# Patient Record
Sex: Female | Born: 1970 | Race: White | Hispanic: No
Health system: Southern US, Community
[De-identification: ages and names within clinical notes are randomized; demographics above are authoritative.]

---

## 1998-01-22 ENCOUNTER — Other Ambulatory Visit: Admission: RE | Admit: 1998-01-22 | Discharge: 1998-01-22 | Payer: Self-pay | Admitting: Gynecology

## 1999-01-28 ENCOUNTER — Other Ambulatory Visit: Admission: RE | Admit: 1999-01-28 | Discharge: 1999-01-28 | Payer: Self-pay | Admitting: Gynecology

## 2000-01-31 ENCOUNTER — Other Ambulatory Visit: Admission: RE | Admit: 2000-01-31 | Discharge: 2000-01-31 | Payer: Self-pay | Admitting: Gynecology

## 2001-02-06 ENCOUNTER — Other Ambulatory Visit: Admission: RE | Admit: 2001-02-06 | Discharge: 2001-02-06 | Payer: Self-pay | Admitting: Gynecology

## 2002-02-12 ENCOUNTER — Other Ambulatory Visit: Admission: RE | Admit: 2002-02-12 | Discharge: 2002-02-12 | Payer: Self-pay | Admitting: Gynecology

## 2003-02-16 ENCOUNTER — Other Ambulatory Visit: Admission: RE | Admit: 2003-02-16 | Discharge: 2003-02-16 | Payer: Self-pay | Admitting: Gynecology

## 2004-02-19 ENCOUNTER — Other Ambulatory Visit: Admission: RE | Admit: 2004-02-19 | Discharge: 2004-02-19 | Payer: Self-pay | Admitting: Gynecology

## 2004-06-03 ENCOUNTER — Other Ambulatory Visit: Admission: RE | Admit: 2004-06-03 | Discharge: 2004-06-03 | Payer: Self-pay | Admitting: Gynecology

## 2005-01-26 ENCOUNTER — Other Ambulatory Visit: Admission: RE | Admit: 2005-01-26 | Discharge: 2005-01-26 | Payer: Self-pay | Admitting: Gynecology

## 2010-05-27 ENCOUNTER — Encounter
Admission: RE | Admit: 2010-05-27 | Discharge: 2010-05-27 | Payer: Self-pay | Source: Home / Self Care | Attending: Internal Medicine | Admitting: Internal Medicine

## 2020-02-12 ENCOUNTER — Ambulatory Visit: Payer: Self-pay

## 2020-03-23 ENCOUNTER — Other Ambulatory Visit: Payer: Self-pay | Admitting: Obstetrics and Gynecology

## 2020-03-23 DIAGNOSIS — Z1231 Encounter for screening mammogram for malignant neoplasm of breast: Secondary | ICD-10-CM

## 2020-04-01 ENCOUNTER — Other Ambulatory Visit: Payer: Self-pay

## 2020-04-01 ENCOUNTER — Ambulatory Visit
Admission: RE | Admit: 2020-04-01 | Discharge: 2020-04-01 | Disposition: A | Discharge status: Home / Self Care | Payer: BC Managed Care – PPO | Source: Ambulatory Visit | Attending: Obstetrics and Gynecology | Admitting: Obstetrics and Gynecology

## 2020-04-01 DIAGNOSIS — Z1231 Encounter for screening mammogram for malignant neoplasm of breast: Secondary | ICD-10-CM | POA: Insufficient documentation

## 2020-04-08 ENCOUNTER — Other Ambulatory Visit: Payer: Self-pay | Admitting: Obstetrics and Gynecology

## 2020-04-08 DIAGNOSIS — R921 Mammographic calcification found on diagnostic imaging of breast: Secondary | ICD-10-CM

## 2020-04-08 DIAGNOSIS — R928 Other abnormal and inconclusive findings on diagnostic imaging of breast: Secondary | ICD-10-CM

## 2020-04-19 ENCOUNTER — Ambulatory Visit
Admission: RE | Admit: 2020-04-19 | Discharge: 2020-04-19 | Disposition: A | Discharge status: Home / Self Care | Payer: BC Managed Care – PPO | Source: Ambulatory Visit | Attending: Obstetrics and Gynecology | Admitting: Obstetrics and Gynecology

## 2020-04-19 ENCOUNTER — Other Ambulatory Visit: Payer: Self-pay

## 2020-04-19 DIAGNOSIS — R921 Mammographic calcification found on diagnostic imaging of breast: Secondary | ICD-10-CM | POA: Insufficient documentation

## 2020-04-19 DIAGNOSIS — R928 Other abnormal and inconclusive findings on diagnostic imaging of breast: Secondary | ICD-10-CM | POA: Diagnosis present

## 2020-04-21 ENCOUNTER — Other Ambulatory Visit: Payer: Self-pay | Admitting: Obstetrics and Gynecology

## 2020-04-21 DIAGNOSIS — Z1231 Encounter for screening mammogram for malignant neoplasm of breast: Secondary | ICD-10-CM

## 2020-06-29 ENCOUNTER — Ambulatory Visit: Payer: BC Managed Care – PPO | Attending: Obstetrics and Gynecology | Admitting: Physical Therapy

## 2020-06-29 ENCOUNTER — Encounter: Payer: Self-pay | Admitting: Physical Therapy

## 2020-06-29 DIAGNOSIS — G8929 Other chronic pain: Secondary | ICD-10-CM

## 2020-06-29 DIAGNOSIS — M545 Low back pain, unspecified: Secondary | ICD-10-CM | POA: Diagnosis present

## 2020-06-29 DIAGNOSIS — R278 Other lack of coordination: Secondary | ICD-10-CM | POA: Insufficient documentation

## 2020-06-29 DIAGNOSIS — M533 Sacrococcygeal disorders, not elsewhere classified: Secondary | ICD-10-CM | POA: Diagnosis not present

## 2020-06-29 DIAGNOSIS — M62838 Other muscle spasm: Secondary | ICD-10-CM | POA: Diagnosis present

## 2020-06-29 DIAGNOSIS — M6208 Separation of muscle (nontraumatic), other site: Secondary | ICD-10-CM

## 2020-06-29 DIAGNOSIS — M26609 Unspecified temporomandibular joint disorder, unspecified side: Secondary | ICD-10-CM | POA: Diagnosis present

## 2020-06-29 NOTE — Patient Instructions (Signed)
Set up work desk with a foot stool for ankles and feet to be at 90deg   __   Avoid straining pelvic floor, abdominal muscles , spine  Use log rolling technique instead of getting out of bed with your neck or the sit-up     Log rolling into and out of bed   Log rolling into and out of bed If getting out of bed on R side, Bent knees, scoot hips/ shoulder to L  Raise R arm completely overhead, rolling onto armpit  Then lower bent knees to bed to get into complete side lying position  Then drop legs off bed, and push up onto R elbow/forearm, and use L hand to push onto the bed
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
## 2020-07-05 ENCOUNTER — Other Ambulatory Visit: Payer: Self-pay

## 2020-07-05 ENCOUNTER — Ambulatory Visit: Payer: BC Managed Care – PPO | Admitting: Physical Therapy

## 2020-07-05 DIAGNOSIS — R278 Other lack of coordination: Secondary | ICD-10-CM

## 2020-07-05 DIAGNOSIS — G8929 Other chronic pain: Secondary | ICD-10-CM

## 2020-07-05 DIAGNOSIS — M6208 Separation of muscle (nontraumatic), other site: Secondary | ICD-10-CM

## 2020-07-05 DIAGNOSIS — M26609 Unspecified temporomandibular joint disorder, unspecified side: Secondary | ICD-10-CM

## 2020-07-05 DIAGNOSIS — M545 Low back pain, unspecified: Secondary | ICD-10-CM

## 2020-07-05 DIAGNOSIS — M62838 Other muscle spasm: Secondary | ICD-10-CM

## 2020-07-05 NOTE — Therapy (Signed)
[facility] MAIN [facility] SERVICES [address] [place], [place], [address] Phone: [phone]   Fax:  [phone]  Physical Therapy Treatment  Patient Details  Name: Dorothy Owens MRN: [id number] Date of Birth: 1970/05/14 Referring Provider (PT): Christeen Douglas   Encounter Date: 07/05/2020   PT End of Session - 07/05/20 1456    Visit Number 2    Number of Visits 10    Date for PT Re-Evaluation 09/08/20    PT Start Time 1307    PT Stop Time 1400    PT Time Calculation (min) 53 min           No past medical history on file.  No past surgical history on file.  There were no vitals filed for this visit.   Subjective Assessment - 07/05/20 1309    Subjective L side of her her jaw pops any time she want it to. The first time she noticed it was when she got her braces taken off at the age of 89. It feels good to pop it. Pt wore braces again in her 30s. The popping returns despite different treatments. The disk is out of place.  Pt has had a injury when falling on the L side of her face over 30 years ago. Pt sleeps on her R side. Her DC has told her that she has a flat curve in her neck. Her neck is always stiff and the area beteen shoulder blades always feels stiff .  Weight training has helped her shoulder blade area. Pt rarely gets HA.    Pertinent History Hx of twisted ankles R and L,  fall on ice skating in 2013 on R buttock, TMJ, not perinmenopausal yet but periods are shorter    Limitations Lifting    Patient Stated Goals improving skeletal health, bone health and balance              [facility] PT Assessment - 07/05/20 1318      Observation/Other Assessments   Observations L shoulder slightly lowered, R ASIS, patella lowered, more L posterior rotation at T/L junction      AROM   Overall AROM Comments cervical sideflexion 55 deg B, flexion, extension 60 deg, R rotation 65 deg, L 70 deg      Palpation   Spinal mobility hypomobile  thoracic segments    SI assessment  tightness at coccgyeus B, hypomobile SIJ on R lacking nutation, hypomobile L lacking ilia ER    Palpation comment tightness R iliocostalis, medial scapula, L medial scapula , hypmobile T/L junction                         [facility] Adult PT Treatment/Exercise - 07/05/20 1456      Neuro Re-ed    Neuro Re-ed Details  cued for HEP customized for asymmetries in thorax , T/L junction      Manual Therapy   Manual therapy comments STM/MWM to address problem areas noted inassessment                       PT Long Term Goals - 06/30/20 1047      PT LONG TERM GOAL #1   Title Pt will demo proper body mechanics to minimize straining of abdomen and pelvic floor with fitness routine (modifications to sit-up/ crunches) ( weight lifting)    Time 4    Period Weeks    Status New  Target Date 07/28/20      PT LONG TERM GOAL #2   Title Pt will report no LBP occuring across across one month to continue with fitness and ADLs activities    Time 8    Period Weeks    Status New    Target Date 08/25/20      PT LONG TERM GOAL #3   Title Pt will make modifcations to her sitting posture/ work station at work in order to minimize overactivity of pelvic floor    Time 8    Period Weeks    Status New    Target Date 08/25/20      PT LONG TERM GOAL #4   Title Pt will demo proper deep core coordination with proper diaphragmatic excursion in order to optimize postural stability and pelvic floor function    Time 10    Period Weeks    Status New    Target Date 09/08/20      PT LONG TERM GOAL #5   Title Pt will report no straining with bowel movements 100% of the time and improved boewl frequency from 3-4x/week to daily or every other day across 2 weeks in order to restore pelvic floor function    Time 6    Period Weeks    Status New    Target Date 08/11/20      Additional Long Term Goals   Additional Long Term Goals Yes      PT LONG TERM  GOAL #6   Title Pt will demo decreased abdominal separation from 3 fingers width above umbilicus to < 1 fingers width and increased lateral diaphragmatic excursion in order to progress to proper pelvic floor lengthening to minimize pelvic pain and restore bowel movements and sexual function without difficulty    Time 10    Period Weeks    Status New      PT LONG TERM GOAL #7   Title Pt will report decreased TMJ pain from 4/10 to < 1/10 and be IND with relaxation practices in order to increase QOL    Time 8    Period Weeks    Status New    Target Date 08/25/20                 Plan - 07/05/20 1457    Clinical Impression Statement Pt showed asymmetries , hypomobility at thoracic junction and pelvic obliquities. Post manual Tx, pt demo'd more levelled shoulder and less posteriorlry rotated L thorax, and more mobility at SIJ and thoracic supine. Continue to apply regional interdependent approach. One-sided HEP was customized for her different body parts' deficits.  Pt continues to benefit from skilled PT      Personal Factors and Comorbidities Fitness;Other    Stability/Clinical Decision Making Evolving/Moderate complexity    Rehab Potential Good    PT Frequency 1x / week    PT Duration Other (comment)   10   PT Treatment/Interventions Balance training;Neuromuscular re-education;Gait training;Moist Heat;Functional mobility training;Therapeutic activities;Patient/family education;Manual techniques;Therapeutic exercise;Taping;Spinal Manipulations;Joint Manipulations;Scar mobilization;Stair training;Traction;Energy conservation    Consulted and Agree with Plan of Care Patient           Patient will benefit from skilled therapeutic intervention in order to improve the following deficits and impairments:  Increased muscle spasms,Decreased mobility,Decreased coordination,Decreased endurance,Decreased activity tolerance,Decreased range of motion,Decreased strength,Improper body  mechanics,Pain,Postural dysfunction  Visit Diagnosis: Diastasis recti  Other lack of coordination  Other muscle spasm  TMJ dysfunction  Chronic bilateral low back pain without sciatica  Problem List There are no problems to display for this patient.   Mariane Masters ,PT, DPT, E-RYT  07/05/2020, 2:58 PM  [facility] MAIN [facility] SERVICES [address] [place], [place], [address] Phone: [phone]   Fax:  [phone]  Name: Sunday Klos MRN: [id number] Date of Birth: 20-Feb-1971

## 2020-07-05 NOTE — Patient Instructions (Addendum)
   Lengthen Back rib by L  shoulder    Lie on R  side , pillow between knees and under head  Pull  arm overhead over mattress, grab the edge of mattress,pull it upward, drawing elbow away from ears  Breathing 10 reps  Open book (handout)  Lying on  R_ side , rotating  L  only this week  Rotating onto pillow /yoga block  Pillow/ Block between knees  10 reps    Side of hip stretch:  Reclined twist for hips and side of the hips/ legs  Lay on your back, knees bend Scoot hips to the R , leave shoulders in place Wobble knees side to side    __  kitchen counter stretches  Hands on kitchen counter,   Palms shoulder width apart  Minisquat postion Trunk is parallel to floor  A) Pull buttocks back to lengthen spine, knees bent  3 breaths   B) Bring R hand to the L, and stretch the R side trunk  3 breaths   Brings hands to center again Do the same to the L side stretch by placing L hand on top of R   D) Modified thread the needle R hand on L thigh, L  thigh pushing out slightly as the R hands pull in,  elbow bent and pulls to theR,  Look under L armpit   Do the same to other side  ____

## 2020-07-13 ENCOUNTER — Ambulatory Visit: Payer: BC Managed Care – PPO | Admitting: Physical Therapy

## 2020-07-13 ENCOUNTER — Other Ambulatory Visit: Payer: Self-pay

## 2020-07-13 DIAGNOSIS — M26609 Unspecified temporomandibular joint disorder, unspecified side: Secondary | ICD-10-CM

## 2020-07-13 DIAGNOSIS — R278 Other lack of coordination: Secondary | ICD-10-CM

## 2020-07-13 DIAGNOSIS — M6208 Separation of muscle (nontraumatic), other site: Secondary | ICD-10-CM | POA: Diagnosis not present

## 2020-07-13 DIAGNOSIS — M62838 Other muscle spasm: Secondary | ICD-10-CM

## 2020-07-13 DIAGNOSIS — G8929 Other chronic pain: Secondary | ICD-10-CM

## 2020-07-13 DIAGNOSIS — M545 Low back pain, unspecified: Secondary | ICD-10-CM

## 2020-07-13 NOTE — Therapy (Addendum)
[facility] MAIN [facility] SERVICES [address] [place], [place], [address] Phone: [phone]   Fax:  [phone]  Physical Therapy Treatment  Patient Details  Name: Dorothy Owens MRN: [id number] Date of Birth: 03/06/1971 Referring Provider (PT): Christeen Douglas   Encounter Date: 07/13/2020   PT End of Session - 07/13/20 1305    Visit Number 3    Number of Visits 10    Date for PT Re-Evaluation 09/08/20    PT Start Time 1300    PT Stop Time 1400    PT Time Calculation (min) 60 min           No past medical history on file.  No past surgical history on file.  There were no vitals filed for this visit.   Subjective Assessment - 07/13/20 1306    Subjective Pt reeported her R shoulder felt sore after last session. Pt had acupuncture to your tailbone area and it helped to reduce the vaginal  itchiness    Pertinent History Hx of twisted ankles R and L,  fall on ice skating in 2013 on R buttock, TMJ, not perinmenopausal yet but periods are shorter    Limitations Lifting    Patient Stated Goals improving skeletal health, bone health and balance              [facility] PT Assessment - 07/13/20 1356      Palpation   Palpation comment tightness at intercostal L osterior T10-12, hypomobile T12-L1, tightenss along spinalis, longissimus, iliocostalis L cervical to T/L junction         levelled iliac crest                [facility] Adult PT Treatment/Exercise - 07/13/20 1353      Neuro Re-ed    Neuro Re-ed Details  cued for open book      Manual Therapy   Manual therapy comments Distraction/ STM/MWM at problem areea noted in assessment                       PT Long Term Goals - 06/30/20 1047      PT LONG TERM GOAL #1   Title Pt will demo proper body mechanics to minimize straining of abdomen and pelvic floor with fitness routine (modifications to sit-up/ crunches) ( weight lifting)    Time 4    Period Weeks     Status New    Target Date 07/28/20      PT LONG TERM GOAL #2   Title Pt will report no LBP occuring across across one month to continue with fitness and ADLs activities    Time 8    Period Weeks    Status New    Target Date 08/25/20      PT LONG TERM GOAL #3   Title Pt will make modifcations to her sitting posture/ work station at work in order to minimize overactivity of pelvic floor    Time 8    Period Weeks    Status New    Target Date 08/25/20      PT LONG TERM GOAL #4   Title Pt will demo proper deep core coordination with proper diaphragmatic excursion in order to optimize postural stability and pelvic floor function    Time 10    Period Weeks    Status New    Target Date 09/08/20      PT LONG TERM GOAL #5   Title Pt  will report no straining with bowel movements 100% of the time and improved boewl frequency from 3-4x/week to daily or every other day across 2 weeks in order to restore pelvic floor function    Time 6    Period Weeks    Status New    Target Date 08/11/20      Additional Long Term Goals   Additional Long Term Goals Yes      PT LONG TERM GOAL #6   Title Pt will demo decreased abdominal separation from 3 fingers width above umbilicus to < 1 fingers width and increased lateral diaphragmatic excursion in order to progress to proper pelvic floor lengthening to minimize pelvic pain and restore bowel movements and sexual function without difficulty    Time 10    Period Weeks    Status New      PT LONG TERM GOAL #7   Title Pt will report decreased TMJ pain from 4/10 to < 1/10 and be IND with relaxation practices in order to increase QOL    Time 8    Period Weeks    Status New    Target Date 08/25/20                 Plan - 07/13/20 1401    Clinical Impression Statement Pt required further manual Tx to optimize lateral. posterior excursion of diaphragm and lengthen tightness at intercostals, paraspinals on L upper quadrant. Added L posterior rotation  into HEP. Plan to address pelvic floor at upcoming sessions and assess fitness exercises. Pt continues to benefit from skilled PT.    Personal Factors and Comorbidities Fitness;Other    Stability/Clinical Decision Making Evolving/Moderate complexity    Rehab Potential Good    PT Frequency 1x / week    PT Duration Other (comment)   10   PT Treatment/Interventions Balance training;Neuromuscular re-education;Gait training;Moist Heat;Functional mobility training;Therapeutic activities;Patient/family education;Manual techniques;Therapeutic exercise;Taping;Spinal Manipulations;Joint Manipulations;Scar mobilization;Stair training;Traction;Energy conservation    Consulted and Agree with Plan of Care Patient           Patient will benefit from skilled therapeutic intervention in order to improve the following deficits and impairments:  Increased muscle spasms,Decreased mobility,Decreased coordination,Decreased endurance,Decreased activity tolerance,Decreased range of motion,Decreased strength,Improper body mechanics,Pain,Postural dysfunction  Visit Diagnosis: Diastasis recti  Other muscle spasm  Other lack of coordination  TMJ dysfunction  Chronic bilateral low back pain without sciatica     Problem List There are no problems to display for this patient.   Mariane Masters ,PT, DPT, E-RYT  07/13/2020, 2:02 PM  [facility] MAIN [facility] SERVICES [address] [place], [place], [address] Phone: [phone]   Fax:  [phone]  Name: Dorothy Owens MRN: [id number] Date of Birth: 10/31/1970

## 2020-07-13 NOTE — Patient Instructions (Signed)
Open book on L side only , R sidelying  15 reps

## 2020-07-21 ENCOUNTER — Ambulatory Visit: Payer: BC Managed Care – PPO | Admitting: Physical Therapy

## 2020-07-22 ENCOUNTER — Other Ambulatory Visit: Payer: Self-pay

## 2020-07-22 ENCOUNTER — Ambulatory Visit: Payer: BC Managed Care – PPO | Admitting: Physical Therapy

## 2020-07-22 DIAGNOSIS — M6208 Separation of muscle (nontraumatic), other site: Secondary | ICD-10-CM

## 2020-07-22 DIAGNOSIS — M26609 Unspecified temporomandibular joint disorder, unspecified side: Secondary | ICD-10-CM

## 2020-07-22 DIAGNOSIS — M62838 Other muscle spasm: Secondary | ICD-10-CM

## 2020-07-22 DIAGNOSIS — R278 Other lack of coordination: Secondary | ICD-10-CM

## 2020-07-22 DIAGNOSIS — G8929 Other chronic pain: Secondary | ICD-10-CM

## 2020-07-22 NOTE — Therapy (Signed)
[place] [facility] MAIN [facility] SERVICES [address] [place], [place], [address] Phone: [phone]   Fax:  [phone]  Physical Therapy Treatment  Patient Details  Name: Dorothy Owens MRN: [id number] Date of Birth: 1971/02/28 Referring Provider (PT): Christeen Douglas   Encounter Date: 07/22/2020   PT End of Session - 07/22/20 1358    Visit Number 4    Number of Visits 10    Date for PT Re-Evaluation 09/08/20    PT Start Time 1304    PT Stop Time 1400    PT Time Calculation (min) 56 min    Activity Tolerance Patient tolerated treatment well    Behavior During Therapy [facility] for tasks assessed/performed           No past medical history on file.  No past surgical history on file.  There were no vitals filed for this visit.   Subjective Assessment - 07/22/20 1306    Subjective Pt reported her boyfriend gave her feedback about her posture. Pt strained her intercostals while laughing hard. R shoulder has been bothering her. When she is doing weight training, she feels her L shoulder pop    Pertinent History Hx of twisted ankles R and L,  fall on ice skating in 2013 on R buttock, TMJ, not perinmenopausal yet but periods are shorter    Limitations Lifting    Patient Stated Goals improving skeletal health, bone health and balance              [facility] PT Assessment - 07/22/20 1308      Observation/Other Assessments   Observations R shoulder lowered than L      Other:   Other/ Comments L thumb at T6, R thumb at T8 behind reach  ( post Tx: T7)   reach behind back, digit III at same location      Palpation   Spinal mobility hypomobile at T10    Palpation comment PA mob Grade III at T-8-12, intercostals posterior/ lateral/ anteriorl B , teres minor. subscapularis R                         [facility] Adult PT Treatment/Exercise - 07/22/20 1355      Neuro Re-ed    Neuro Re-ed Details  cued for expanded diaphragm . pelvic floor       Manual Therapy   Manual therapy comments PA mob Grade III at T-8-12, intercostals posterior/ lateral/ anteriorl B , teres minor. subscapularis R ,                       PT Long Term Goals - 06/30/20 1047      PT LONG TERM GOAL #1   Title Pt will demo proper body mechanics to minimize straining of abdomen and pelvic floor with fitness routine (modifications to sit-up/ crunches) ( weight lifting)    Time 4    Period Weeks    Status New    Target Date 07/28/20      PT LONG TERM GOAL #2   Title Pt will report no LBP occuring across across one month to continue with fitness and ADLs activities    Time 8    Period Weeks    Status New    Target Date 08/25/20      PT LONG TERM GOAL #3   Title Pt will make modifcations to her sitting posture/ work station at work in order to minimize  overactivity of pelvic floor    Time 8    Period Weeks    Status New    Target Date 08/25/20      PT LONG TERM GOAL #4   Title Pt will demo proper deep core coordination with proper diaphragmatic excursion in order to optimize postural stability and pelvic floor function    Time 10    Period Weeks    Status New    Target Date 09/08/20      PT LONG TERM GOAL #5   Title Pt will report no straining with bowel movements 100% of the time and improved boewl frequency from 3-4x/week to daily or every other day across 2 weeks in order to restore pelvic floor function    Time 6    Period Weeks    Status New    Target Date 08/11/20      Additional Long Term Goals   Additional Long Term Goals Yes      PT LONG TERM GOAL #6   Title Pt will demo decreased abdominal separation from 3 fingers width above umbilicus to < 1 fingers width and increased lateral diaphragmatic excursion in order to progress to proper pelvic floor lengthening to minimize pelvic pain and restore bowel movements and sexual function without difficulty    Time 10    Period Weeks    Status New      PT LONG TERM GOAL #7   Title  Pt will report decreased TMJ pain from 4/10 to < 1/10 and be IND with relaxation practices in order to increase QOL    Time 8    Period Weeks    Status New    Target Date 08/25/20                 Plan - 07/22/20 1358    Clinical Impression Statement Pt required more manual Tx to minimize tightness at R thoracic tightness. Pt reported it does not feel as tight / constricted at her ribs to breathe. Pt demo'd improved diaphragmatic excursion which will help with pelvic floor lengthening and improved functional reaching with R hand behind back. . Plan to assess pelvic floor at upcoming session and modifying her work out routine. Pt continues to benefit from skilled PT.    Personal Factors and Comorbidities Fitness;Other    Stability/Clinical Decision Making Evolving/Moderate complexity    Rehab Potential Good    PT Frequency 1x / week    PT Duration Other (comment)   10   PT Treatment/Interventions Balance training;Neuromuscular re-education;Gait training;Moist Heat;Functional mobility training;Therapeutic activities;Patient/family education;Manual techniques;Therapeutic exercise;Taping;Spinal Manipulations;Joint Manipulations;Scar mobilization;Stair training;Traction;Energy conservation    Consulted and Agree with Plan of Care Patient           Patient will benefit from skilled therapeutic intervention in order to improve the following deficits and impairments:  Increased muscle spasms,Decreased mobility,Decreased coordination,Decreased endurance,Decreased activity tolerance,Decreased range of motion,Decreased strength,Improper body mechanics,Pain,Postural dysfunction  Visit Diagnosis: Diastasis recti  Other muscle spasm  Other lack of coordination  TMJ dysfunction  Chronic bilateral low back pain without sciatica     Problem List There are no problems to display for this patient.   Mariane Masters ,PT, DPT, E-RYT  07/22/2020, 2:01 PM  [place] [facility] MAIN [facility] SERVICES [address] [place], [place], [address] Phone: [phone]   Fax:  [phone]  Name: Davona Kinoshita MRN: [id number] Date of Birth: [birth year]

## 2020-07-27 ENCOUNTER — Other Ambulatory Visit: Payer: Self-pay

## 2020-07-27 ENCOUNTER — Ambulatory Visit: Payer: BC Managed Care – PPO | Admitting: Physical Therapy

## 2020-07-27 ENCOUNTER — Encounter: Payer: Self-pay | Admitting: Physical Therapy

## 2020-07-27 DIAGNOSIS — M62838 Other muscle spasm: Secondary | ICD-10-CM

## 2020-07-27 DIAGNOSIS — M6208 Separation of muscle (nontraumatic), other site: Secondary | ICD-10-CM | POA: Diagnosis not present

## 2020-07-27 DIAGNOSIS — G8929 Other chronic pain: Secondary | ICD-10-CM

## 2020-07-27 DIAGNOSIS — R278 Other lack of coordination: Secondary | ICD-10-CM

## 2020-07-27 DIAGNOSIS — M26609 Unspecified temporomandibular joint disorder, unspecified side: Secondary | ICD-10-CM

## 2020-07-27 NOTE — Patient Instructions (Signed)
Squeeze shoulders down and back, chin tuck in fitness routine with upper arm exercises  positional techniques for less pelvic pain

## 2020-07-27 NOTE — Therapy (Addendum)
[place] [facility] MAIN [facility] SERVICES [address] [place], [place], [address] Phone: [phone]   Fax:  [phone]  Physical Therapy Treatment  Patient Details  Name: Dorothy Owens MRN: [id number] Date of Birth: 1970/12/27 Referring Provider (PT): Christeen Douglas   Encounter Date: 07/27/2020   PT End of Session - 07/27/20 1358    Visit Number 5    Number of Visits 10    Date for PT Re-Evaluation 09/08/20    PT Start Time 1303    PT Stop Time 1404    PT Time Calculation (min) 61 min    Activity Tolerance Patient tolerated treatment well    Behavior During Therapy [facility] for tasks assessed/performed           History reviewed. No pertinent past medical history.  History reviewed. No pertinent surgical history.  There were no vitals filed for this visit.   Subjective Assessment - 07/27/20 1306    Subjective Pt reported after last session , she was able to breath for the first time without pain in the ribcage. There was a little bit of pain on teh R back rib. Pt did a workout on Saturday and she noticed catching in her clavical and L shoulder, bottom L rib. Last night, she did bench presses and Pavlov presses and dead bug presses.    Pertinent History Hx of twisted ankles R and L,  fall on ice skating in 2013 on R buttock, TMJ, not perinmenopausal yet but periods are shorter    Limitations Lifting    Patient Stated Goals improving skeletal health, bone health and balance              [facility] PT Assessment - 07/27/20 1320      Other:   Other/ Comments quadriped/  pushing will upepr trap overuse      Palpation   SI assessment  levelled pelvic girdle,    Palpation comment increased paraspinal R T/L junction, scalenes B   delayed R scapular downward, dyskinesis                        [facility] Adult PT Treatment/Exercise - 07/27/20 1356      Neuro Re-ed    Neuro Re-ed Details  cued for pelvic floor quick sqeeze, scapular  retraction and depression      Moist Heat Therapy   Number Minutes Moist Heat 5 Minutes    Moist Heat Location --   thoracic     Manual Therapy   Manual therapy comments STM/MWM, PA mob GRade III atT10-12, intercostals, scalenes B                       PT Long Term Goals - 06/30/20 1047      PT LONG TERM GOAL #1   Title Pt will demo proper body mechanics to minimize straining of abdomen and pelvic floor with fitness routine (modifications to sit-up/ crunches) ( weight lifting)    Time 4    Period Weeks    Status New    Target Date 07/28/20      PT LONG TERM GOAL #2   Title Pt will report no LBP occuring across across one month to continue with fitness and ADLs activities    Time 8    Period Weeks    Status New    Target Date 08/25/20      PT LONG TERM GOAL #3   Title Pt  will make modifcations to her sitting posture/ work station at work in order to minimize overactivity of pelvic floor    Time 8    Period Weeks    Status New    Target Date 08/25/20      PT LONG TERM GOAL #4   Title Pt will demo proper deep core coordination with proper diaphragmatic excursion in order to optimize postural stability and pelvic floor function    Time 10    Period Weeks    Status New    Target Date 09/08/20      PT LONG TERM GOAL #5   Title Pt will report no straining with bowel movements 100% of the time and improved boewl frequency from 3-4x/week to daily or every other day across 2 weeks in order to restore pelvic floor function    Time 6    Period Weeks    Status New    Target Date 08/11/20      Additional Long Term Goals   Additional Long Term Goals Yes      PT LONG TERM GOAL #6   Title Pt will demo decreased abdominal separation from 3 fingers width above umbilicus to < 1 fingers width and increased lateral diaphragmatic excursion in order to progress to proper pelvic floor lengthening to minimize pelvic pain and restore bowel movements and sexual function without  difficulty    Time 10    Period Weeks    Status New      PT LONG TERM GOAL #7   Title Pt will report decreased TMJ pain from 4/10 to < 1/10 and be IND with relaxation practices in order to increase QOL    Time 8    Period Weeks    Status New    Target Date 08/25/20                 Plan - 07/27/20 1358    Clinical Impression Statement Pt required further manual Tx to minimize tightness of interspinal/ paraspinal mm at R T/L junction and scalenes. Pt also showed improved scapular retraction/depression after cues. Pt was cued for less upper trap overuse in fitness/ quadriped positions. Pt continues to benefit from skilled PT. Plan to review her fitness routine at next session.        Personal Factors and Comorbidities Fitness;Other    Stability/Clinical Decision Making Evolving/Moderate complexity    Rehab Potential Good    PT Frequency 1x / week    PT Duration Other (comment)   10   PT Treatment/Interventions Balance training;Neuromuscular re-education;Gait training;Moist Heat;Functional mobility training;Therapeutic activities;Patient/family education;Manual techniques;Therapeutic exercise;Taping;Spinal Manipulations;Joint Manipulations;Scar mobilization;Stair training;Traction;Energy conservation    Consulted and Agree with Plan of Care Patient           Patient will benefit from skilled therapeutic intervention in order to improve the following deficits and impairments:  Increased muscle spasms,Decreased mobility,Decreased coordination,Decreased endurance,Decreased activity tolerance,Decreased range of motion,Decreased strength,Improper body mechanics,Pain,Postural dysfunction  Visit Diagnosis: Diastasis recti  Other muscle spasm  Other lack of coordination  TMJ dysfunction  Chronic bilateral low back pain without sciatica     Problem List There are no problems to display for this patient.   Mariane Masters ,PT, DPT, E-RYT  07/27/2020, 1:59 PM  [facility] MAIN [facility] SERVICES [address] [place], [place], [address] Phone: [phone]   Fax:  [phone]  Name: Dorothy Owens MRN: [id number] Date of Birth: October 26, 1970

## 2020-08-03 ENCOUNTER — Ambulatory Visit: Payer: BC Managed Care – PPO | Admitting: Physical Therapy

## 2020-08-04 ENCOUNTER — Ambulatory Visit: Payer: BC Managed Care – PPO | Attending: Obstetrics and Gynecology | Admitting: Physical Therapy

## 2020-08-04 DIAGNOSIS — M545 Low back pain, unspecified: Secondary | ICD-10-CM | POA: Insufficient documentation

## 2020-08-04 DIAGNOSIS — M6208 Separation of muscle (nontraumatic), other site: Secondary | ICD-10-CM | POA: Insufficient documentation

## 2020-08-04 DIAGNOSIS — R278 Other lack of coordination: Secondary | ICD-10-CM | POA: Insufficient documentation

## 2020-08-04 DIAGNOSIS — G8929 Other chronic pain: Secondary | ICD-10-CM | POA: Insufficient documentation

## 2020-08-04 DIAGNOSIS — M26609 Unspecified temporomandibular joint disorder, unspecified side: Secondary | ICD-10-CM | POA: Insufficient documentation

## 2020-08-04 DIAGNOSIS — M62838 Other muscle spasm: Secondary | ICD-10-CM | POA: Insufficient documentation

## 2020-08-05 ENCOUNTER — Other Ambulatory Visit: Payer: Self-pay

## 2020-08-05 ENCOUNTER — Ambulatory Visit: Payer: BC Managed Care – PPO | Admitting: Physical Therapy

## 2020-08-05 DIAGNOSIS — M6208 Separation of muscle (nontraumatic), other site: Secondary | ICD-10-CM | POA: Diagnosis not present

## 2020-08-05 DIAGNOSIS — R278 Other lack of coordination: Secondary | ICD-10-CM

## 2020-08-05 DIAGNOSIS — M62838 Other muscle spasm: Secondary | ICD-10-CM

## 2020-08-05 DIAGNOSIS — M545 Low back pain, unspecified: Secondary | ICD-10-CM | POA: Diagnosis present

## 2020-08-05 DIAGNOSIS — G8929 Other chronic pain: Secondary | ICD-10-CM | POA: Diagnosis present

## 2020-08-05 DIAGNOSIS — M26609 Unspecified temporomandibular joint disorder, unspecified side: Secondary | ICD-10-CM

## 2020-08-05 NOTE — Patient Instructions (Signed)
Locust pose  Pillow under hips if needed for decreased low back pain  Palms face midline by hips  Finger tips shooting straight down  Imagine holding pencil under your armpits Draw shoulders away from ears Inhale Exhale lift chest up slightly without feeling it in your back. The bend happens in the midback  (keep chin tucked)   10 reps  __  Low cobra: modified   On belly, palms under armpits, elbows pointed to ceiling  Inhale: lengthen crown of the head away from shoulders Exhale, feel belly hug in and press palms into the floor, squeezing elbows/shoulder blades towards each other while chest lifts about 5 cm off of the floor. You should feel the hinging movement at mid back and not the low back.   10 reps

## 2020-08-06 NOTE — Therapy (Signed)
[place] [facility] MAIN [facility] SERVICES [address] [place], [place], [address] Phone: [phone]   Fax:  [phone]  Physical Therapy Treatment  Patient Details  Name: Dorothy Owens MRN: [id number] Date of Birth: Sep 18, 1970 Referring Provider (PT): Christeen Douglas   Encounter Date: 08/05/2020   PT End of Session - 08/05/20 1309    Visit Number 6    Number of Visits 10    Date for PT Re-Evaluation 09/08/20    PT Start Time 1300    PT Stop Time 1400    PT Time Calculation (min) 60 min    Activity Tolerance Patient tolerated treatment well    Behavior During Therapy [facility] for tasks assessed/performed           No past medical history on file.  No past surgical history on file.  There were no vitals filed for this visit.   Subjective Assessment - 08/05/20 1310    Subjective Pt reported she feels her shoulder is looser but there is still  catch on the R.    Pertinent History Hx of twisted ankles R and L,  fall on ice skating in 2013 on R buttock, TMJ, not perinmenopausal yet but periods are shorter    Limitations Lifting    Patient Stated Goals improving skeletal health, bone health and balance              [facility] PT Assessment - 08/06/20 0812      Coordination   Coordination and Movement Description minor delayed R scapular downward, dyskinesis      Palpation   Spinal mobility tightness at L T/J junction, R upper scapular    SI assessment  L SIJ hypomoile in FADDIR                         [facility] Adult PT Treatment/Exercise - 08/06/20 0109      Neuro Re-ed    Neuro Re-ed Details  cued for cervicoscapular stabilizing HEP      Moist Heat Therapy   Number Minutes Moist Heat 5 Minutes    Moist Heat Location --   thoracic     Manual Therapy   Manual therapy comments STM/MWM, PA mob Grade III, interspinals L and areas noted in assessment                       PT Long Term Goals - 06/30/20 1047       PT LONG TERM GOAL #1   Title Pt will demo proper body mechanics to minimize straining of abdomen and pelvic floor with fitness routine (modifications to sit-up/ crunches) ( weight lifting)    Time 4    Period Weeks    Status New    Target Date 07/28/20      PT LONG TERM GOAL #2   Title Pt will report no LBP occuring across across one month to continue with fitness and ADLs activities    Time 8    Period Weeks    Status New    Target Date 08/25/20      PT LONG TERM GOAL #3   Title Pt will make modifcations to her sitting posture/ work station at work in order to minimize overactivity of pelvic floor    Time 8    Period Weeks    Status New    Target Date 08/25/20      PT LONG TERM GOAL #4  Title Pt will demo proper deep core coordination with proper diaphragmatic excursion in order to optimize postural stability and pelvic floor function    Time 10    Period Weeks    Status New    Target Date 09/08/20      PT LONG TERM GOAL #5   Title Pt will report no straining with bowel movements 100% of the time and improved boewl frequency from 3-4x/week to daily or every other day across 2 weeks in order to restore pelvic floor function    Time 6    Period Weeks    Status New    Target Date 08/11/20      Additional Long Term Goals   Additional Long Term Goals Yes      PT LONG TERM GOAL #6   Title Pt will demo decreased abdominal separation from 3 fingers width above umbilicus to < 1 fingers width and increased lateral diaphragmatic excursion in order to progress to proper pelvic floor lengthening to minimize pelvic pain and restore bowel movements and sexual function without difficulty    Time 10    Period Weeks    Status New      PT LONG TERM GOAL #7   Title Pt will report decreased TMJ pain from 4/10 to < 1/10 and be IND with relaxation practices in order to increase QOL    Time 8    Period Weeks    Status New    Target Date 08/25/20                 Plan - 08/05/20  1309    Clinical Impression Statement Pt continued to require manual Tx to minimize R upper quadrant tightness and restrictions. Pt continues to report it no longer hurts to breathe and demostrates improved diaphragmatic excursion. Pt required cues for cervical-scapular retraction and stabilizing principles. Pt continues to benefit from skilled PT    Personal Factors and Comorbidities Fitness;Other    Stability/Clinical Decision Making Evolving/Moderate complexity    Rehab Potential Good    PT Frequency 1x / week    PT Duration Other (comment)   10   PT Treatment/Interventions Balance training;Neuromuscular re-education;Gait training;Moist Heat;Functional mobility training;Therapeutic activities;Patient/family education;Manual techniques;Therapeutic exercise;Taping;Spinal Manipulations;Joint Manipulations;Scar mobilization;Stair training;Traction;Energy conservation    Consulted and Agree with Plan of Care Patient           Patient will benefit from skilled therapeutic intervention in order to improve the following deficits and impairments:  Increased muscle spasms,Decreased mobility,Decreased coordination,Decreased endurance,Decreased activity tolerance,Decreased range of motion,Decreased strength,Improper body mechanics,Pain,Postural dysfunction  Visit Diagnosis: Diastasis recti  Other muscle spasm  Other lack of coordination  TMJ dysfunction  Chronic bilateral low back pain without sciatica     Problem List There are no problems to display for this patient.   Mariane Masters ,PT, DPT, E-RYT  08/06/2020, 8:13 AM  [place] [facility] MAIN [facility] SERVICES [address] [place], [place], [address] Phone: [phone]   Fax:  [phone]  Name: Ziona Wickens MRN: [id number] Date of Birth: 1970/10/10

## 2020-08-09 ENCOUNTER — Other Ambulatory Visit: Payer: Self-pay

## 2020-08-09 ENCOUNTER — Ambulatory Visit: Payer: BC Managed Care – PPO | Admitting: Physical Therapy

## 2020-08-09 DIAGNOSIS — R278 Other lack of coordination: Secondary | ICD-10-CM

## 2020-08-09 DIAGNOSIS — M6208 Separation of muscle (nontraumatic), other site: Secondary | ICD-10-CM

## 2020-08-09 DIAGNOSIS — M545 Low back pain, unspecified: Secondary | ICD-10-CM

## 2020-08-09 DIAGNOSIS — M26609 Unspecified temporomandibular joint disorder, unspecified side: Secondary | ICD-10-CM

## 2020-08-09 DIAGNOSIS — G8929 Other chronic pain: Secondary | ICD-10-CM

## 2020-08-09 DIAGNOSIS — M62838 Other muscle spasm: Secondary | ICD-10-CM

## 2020-08-09 NOTE — Therapy (Signed)
[place] [facility] [facility] MAIN [facility] SERVICES [address] [place], [place], [address] Phone: [phone]   Fax:  [phone]  Physical Therapy Treatment  Patient Details  Name: Dorothy Owens MRN: [id number] Date of Birth: 1971-04-19 Referring Provider (PT): Christeen Douglas   Encounter Date: 08/09/2020   PT End of Session - 08/09/20 1356    Visit Number 7    Number of Visits 10    Date for PT Re-Evaluation 09/08/20    PT Start Time 1302    PT Stop Time 1400    PT Time Calculation (min) 58 min    Activity Tolerance Patient tolerated treatment well    Behavior During Therapy [facility] for tasks assessed/performed           No past medical history on file.  No past surgical history on file.  There were no vitals filed for this visit.   Subjective Assessment - 08/09/20 1307    Subjective Pt reported she feels her shoulder is looser but there is still  catch on the R.    Pertinent History Hx of twisted ankles R and L,  fall on ice skating in 2013 on R buttock, TMJ, not perinmenopausal yet but periods are shorter    Limitations Lifting    Patient Stated Goals improving skeletal health, bone health and balance              [facility] PT Assessment - 08/09/20 1358      Observation/Other Assessments   Observations excessive T/J lordosis      Other:   Other/Comments shooulder IR , cleaning with kettlebell swing with catch with shoulder blades      Palpation   Palpation comment tightness at R wrist/ pain with wall push up , overuse of upper trap                         [facility] Adult PT Treatment/Exercise - 08/09/20 1404      Therapeutic Activites    Other Therapeutic Activities modified kettle cleans, push ups      Neuro Re-ed    Neuro Re-ed Details  cued for seated postural control, scapulothoracic stabilization with less lumbar lordosis      Manual Therapy   Manual therapy comments STM/MWM shoulder ER, forearm supination, distraction  at wrists,                       PT Long Term Goals - 06/30/20 1047      PT LONG TERM GOAL #1   Title Pt will demo proper body mechanics to minimize straining of abdomen and pelvic floor with fitness routine (modifications to sit-up/ crunches) ( weight lifting)    Time 4    Period Weeks    Status New    Target Date 07/28/20      PT LONG TERM GOAL #2   Title Pt will report no LBP occuring across across one month to continue with fitness and ADLs activities    Time 8    Period Weeks    Status New    Target Date 08/25/20      PT LONG TERM GOAL #3   Title Pt will make modifcations to her sitting posture/ work station at work in order to minimize overactivity of pelvic floor    Time 8    Period Weeks    Status New    Target Date 08/25/20      PT  LONG TERM GOAL #4   Title Pt will demo proper deep core coordination with proper diaphragmatic excursion in order to optimize postural stability and pelvic floor function    Time 10    Period Weeks    Status New    Target Date 09/08/20      PT LONG TERM GOAL #5   Title Pt will report no straining with bowel movements 100% of the time and improved boewl frequency from 3-4x/week to daily or every other day across 2 weeks in order to restore pelvic floor function    Time 6    Period Weeks    Status New    Target Date 08/11/20      Additional Long Term Goals   Additional Long Term Goals Yes      PT LONG TERM GOAL #6   Title Pt will demo decreased abdominal separation from 3 fingers width above umbilicus to < 1 fingers width and increased lateral diaphragmatic excursion in order to progress to proper pelvic floor lengthening to minimize pelvic pain and restore bowel movements and sexual function without difficulty    Time 10    Period Weeks    Status New      PT LONG TERM GOAL #7   Title Pt will report decreased TMJ pain from 4/10 to < 1/10 and be IND with relaxation practices in order to increase QOL    Time 8     Period Weeks    Status New    Target Date 08/25/20                 Plan - 08/09/20 1357    Clinical Impression Statement Pt demo'd less paraspinal mm tightness and deviations to spine. Pt required cues for postural stability and T/L junction hypermobility with kettle bell clean motion and wall pushups. Pt required manual Tx to promote more wrist extensions, scapular retraction in wall push ups to minimize upper traps. Pt continues to demo improved diaphragmatic excursion but required cues for less lumbar lordosis with inhalation. Plan to add more thoracolumbar strengthening and fitness modifications with stability principles to minimize injuries. Plan to address pelvic floor at upcoming session after pt demonstrates better deep core coordination .   Pt continues to benefit from skilled PT      Personal Factors and Comorbidities Fitness;Other    Stability/Clinical Decision Making Evolving/Moderate complexity    Rehab Potential Good    PT Frequency 1x / week    PT Duration Other (comment)   10   PT Treatment/Interventions Balance training;Neuromuscular re-education;Gait training;Moist Heat;Functional mobility training;Therapeutic activities;Patient/family education;Manual techniques;Therapeutic exercise;Taping;Spinal Manipulations;Joint Manipulations;Scar mobilization;Stair training;Traction;Energy conservation    Consulted and Agree with Plan of Care Patient           Patient will benefit from skilled therapeutic intervention in order to improve the following deficits and impairments:  Increased muscle spasms,Decreased mobility,Decreased coordination,Decreased endurance,Decreased activity tolerance,Decreased range of motion,Decreased strength,Improper body mechanics,Pain,Postural dysfunction  Visit Diagnosis: Diastasis recti  Other muscle spasm  Other lack of coordination  TMJ dysfunction  Chronic bilateral low back pain without sciatica     Problem List There are no  problems to display for this patient.   Mariane Masters ,PT, DPT, E-RYT  08/09/2020, 2:06 PM  [place] [facility] MAIN [facility] SERVICES [address] [place], [place], [address] Phone: [phone]   Fax:  [phone]  Name: Dorothy Owens MRN: [id number] Date of Birth: 1971-04-27

## 2020-08-09 NOTE — Patient Instructions (Addendum)
Puff up at chest and low back Head tall  __  kettle ball hold like suitcase  shoulder blade on spine , shoulder socket  Elbow by ribs   __  heels into floor with wall push , elbow up close to ribs   __  Ballmounds down of palms in push up

## 2020-08-18 ENCOUNTER — Ambulatory Visit: Payer: BC Managed Care – PPO | Admitting: Physical Therapy

## 2020-08-25 ENCOUNTER — Other Ambulatory Visit: Payer: Self-pay

## 2020-08-25 ENCOUNTER — Ambulatory Visit: Payer: BC Managed Care – PPO | Admitting: Physical Therapy

## 2020-08-25 DIAGNOSIS — R278 Other lack of coordination: Secondary | ICD-10-CM

## 2020-08-25 DIAGNOSIS — M62838 Other muscle spasm: Secondary | ICD-10-CM

## 2020-08-25 DIAGNOSIS — M6208 Separation of muscle (nontraumatic), other site: Secondary | ICD-10-CM

## 2020-08-25 DIAGNOSIS — M26609 Unspecified temporomandibular joint disorder, unspecified side: Secondary | ICD-10-CM

## 2020-08-25 DIAGNOSIS — G8929 Other chronic pain: Secondary | ICD-10-CM

## 2020-08-26 NOTE — Patient Instructions (Signed)
Multifidis twist  Band is on doorknob: stand further away from door (facing perpendicular)   Twisting trunk without moving the hips and knees Hold band at the level of ribcage, elbows bent,shoulder blades roll back and down like squeezing a pencil under armpit    Exhale twist,.10-15 deg away from door without moving your hips/ knees. Continue to maintain equal weight through legs. Keep knee unlocked.  20 x 2   __  Band over door Arms in "w" position Minisquat: Scoot buttocks back slight, hinge like you are looking at your reflection on a pond  Knees behind toes,  Inhale to "smell flowers"  Exhale on the rise "like rocket"  Do not lock knees, have more weight across ballmounds of feet, toes relaxed   10 reps x 3 x day   ___  Rotator cuff series: of L arm only  20 reps   Stand perpendicular to door,   1) internal rotation 2) external rotation  3) tricep in lunge ( lean forward, AVOID arching back)    Band in L hand, R hand stabilizing it at hip  1) thumb up, shoulder abduction 30 deg 2) thumb forward like carrying a handle, fingers towards body, shoulder abduction 30 deg

## 2020-08-26 NOTE — Therapy (Signed)
[place] [facility] MAIN [facility] SERVICES [address] [place], [place], [address] Phone: [phone]   Fax:  [phone]  Physical Therapy Treatment  Patient Details  Name: Dorothy Owens MRN: [id number] Date of Birth: 04/28/1971 Referring Provider (PT): Christeen Douglas   Encounter Date: 08/25/2020   PT End of Session - 08/26/20 1036    Visit Number 8    Number of Visits 10    Date for PT Re-Evaluation 09/08/20    PT Start Time 1303    PT Stop Time 1404    PT Time Calculation (min) 61 min    Activity Tolerance Patient tolerated treatment well    Behavior During Therapy [facility] for tasks assessed/performed           No past medical history on file.  No past surgical history on file.  There were no vitals filed for this visit.   Subjective Assessment - 08/25/20 1307    Subjective Pt reported she tried to  picked up the kettle bell in her workout and noticed popping at shoulder blade area like something is rubbing over the rib. Pt noticed it popping when putting away a medicine ball.    Pertinent History Hx of twisted ankles R and L,  fall on ice skating in 2013 on R buttock, TMJ, not perinmenopausal yet but periods are shorter    Limitations Lifting    Patient Stated Goals improving skeletal health, bone health and balance              [facility] PT Assessment - 08/26/20 1140      Strength   Overall Strength Comments LUE 4-/5, R 5/5      Palpation   Spinal mobility hypomobile T7-12 PAVM, L interspinal mm tightness    SI assessment  levelled shoulders and iliac crest,  L posterior rotation less of thorax                         [facility] Adult PT Treatment/Exercise - 08/26/20 1146      Neuro Re-ed    Neuro Re-ed Details  cued for rotator cuff L, B  scapulothroacic and multifidis strengthening      Moist Heat Therapy   Moist Heat Location --   throacic , with new HEP instruction     Manual Therapy   Manual therapy comments PA  mob Grade III T7-10, STM/ interspinals.                       PT Long Term Goals - 06/30/20 1047      PT LONG TERM GOAL #1   Title Pt will demo proper body mechanics to minimize straining of abdomen and pelvic floor with fitness routine (modifications to sit-up/ crunches) ( weight lifting)    Time 4    Period Weeks    Status New    Target Date 07/28/20      PT LONG TERM GOAL #2   Title Pt will report no LBP occuring across across one month to continue with fitness and ADLs activities    Time 8    Period Weeks    Status New    Target Date 08/25/20      PT LONG TERM GOAL #3   Title Pt will make modifcations to her sitting posture/ work station at work in order to minimize overactivity of pelvic floor    Time 8    Period Weeks  Status New    Target Date 08/25/20      PT LONG TERM GOAL #4   Title Pt will demo proper deep core coordination with proper diaphragmatic excursion in order to optimize postural stability and pelvic floor function    Time 10    Period Weeks    Status New    Target Date 09/08/20      PT LONG TERM GOAL #5   Title Pt will report no straining with bowel movements 100% of the time and improved boewl frequency from 3-4x/week to daily or every other day across 2 weeks in order to restore pelvic floor function    Time 6    Period Weeks    Status New    Target Date 08/11/20      Additional Long Term Goals   Additional Long Term Goals Yes      PT LONG TERM GOAL #6   Title Pt will demo decreased abdominal separation from 3 fingers width above umbilicus to < 1 fingers width and increased lateral diaphragmatic excursion in order to progress to proper pelvic floor lengthening to minimize pelvic pain and restore bowel movements and sexual function without difficulty    Time 10    Period Weeks    Status New      PT LONG TERM GOAL #7   Title Pt will report decreased TMJ pain from 4/10 to < 1/10 and be IND with relaxation practices in order to  increase QOL    Time 8    Period Weeks    Status New    Target Date 08/25/20                 Plan - 08/26/20 1037    Clinical Impression Statement Pt showed less L posterior thoracic rotation which is good carry over from last session. Further addressed hypomobility and deviations at thoracic segments T7-10 and progressed to L RTC strengthening and B thoracolumbar strengthening. Pt required cues for proper form for new HEP and optimal diaphragmatic excursion. Anticipate thoracic corrections and optimal diaphragmatic and strengthening underused muscles  will help TMJ and LBP and minimize injuries in her weightlifting and gym programs. . Pt will continue to require skilled PT.   Personal Factors and Comorbidities Fitness;Other    Stability/Clinical Decision Making Evolving/Moderate complexity    Rehab Potential Good    PT Frequency 1x / week    PT Duration Other (comment)   10   PT Treatment/Interventions Balance training;Neuromuscular re-education;Gait training;Moist Heat;Functional mobility training;Therapeutic activities;Patient/family education;Manual techniques;Therapeutic exercise;Taping;Spinal Manipulations;Joint Manipulations;Scar mobilization;Stair training;Traction;Energy conservation    Consulted and Agree with Plan of Care Patient           Patient will benefit from skilled therapeutic intervention in order to improve the following deficits and impairments:  Increased muscle spasms,Decreased mobility,Decreased coordination,Decreased endurance,Decreased activity tolerance,Decreased range of motion,Decreased strength,Improper body mechanics,Pain,Postural dysfunction  Visit Diagnosis: Diastasis recti  Other muscle spasm  Other lack of coordination  TMJ dysfunction  Chronic bilateral low back pain without sciatica     Problem List There are no problems to display for this patient.   Mariane Masters ,PT, DPT, E-RYT  08/26/2020, 11:47 AM  [place] [facility] MAIN [facility] SERVICES [address] [place], [place], [address] Phone: [phone]   Fax:  [phone]  Name: Dorothy Owens MRN: [id number] Date of Birth: 1971/01/18

## 2020-09-01 ENCOUNTER — Ambulatory Visit: Payer: BC Managed Care – PPO | Attending: Obstetrics and Gynecology | Admitting: Physical Therapy

## 2020-09-01 ENCOUNTER — Other Ambulatory Visit: Payer: Self-pay

## 2020-09-01 DIAGNOSIS — R278 Other lack of coordination: Secondary | ICD-10-CM | POA: Insufficient documentation

## 2020-09-01 DIAGNOSIS — G8929 Other chronic pain: Secondary | ICD-10-CM | POA: Diagnosis present

## 2020-09-01 DIAGNOSIS — M62838 Other muscle spasm: Secondary | ICD-10-CM | POA: Insufficient documentation

## 2020-09-01 DIAGNOSIS — M6208 Separation of muscle (nontraumatic), other site: Secondary | ICD-10-CM | POA: Diagnosis present

## 2020-09-01 DIAGNOSIS — M545 Low back pain, unspecified: Secondary | ICD-10-CM | POA: Insufficient documentation

## 2020-09-01 DIAGNOSIS — M26609 Unspecified temporomandibular joint disorder, unspecified side: Secondary | ICD-10-CM

## 2020-09-01 NOTE — Patient Instructions (Addendum)
  Clam Shell 45 Degrees  Lying with hips and knees bent 45, one pillow between knees and ankles. Heel together, toes apart like ballerina,  Lift knee with exhale while pressing heels together. Be sure pelvis does not roll backward. Do not arch back. Do 20 times, each leg, 2 times per day.     Complimentary stretch with strap   1) Figure-4   Cross _ foot over _ thigh, opposite knee straight   3 breaths  * Keep pelvis levelled with tactile cue with hand under back of hips  * Slide the ankle of the supporting foot out to decrease the angle which can help level the pelvis    2) Cross thigh over     3) IT band _scoot hips to R, cross R leg over L and straighten knee with strap on ballmound,    4)  Adductors and pelvic floor ( Happy Baby) : Esmond Harps are wide towards armpits, sole of feet towards ceiling   _ one knee bent at a time,   5)  Quad in sidelying _strap around the ankle, pulling ankle towards buttocks  Bottom leg firm and stabilization with knee bent   __ Backward lunges with band at doorknob  2 mins . Elbows by ribs, shoulders down and back  Front knee in place above ankle, back foot and toes pointed forward, ( do not turn the hips and toes out) . Heel is up to be able to push off and return R foot next to the L at hip width apart  Carry your center with you as you step to maintain 50% weight in both legs.      WALKING WITH RESISTANCE BLUE Band at waist connected to doorknob Stepping forward normal length steps, planting mid and forefoot down, center of mass ( navel) leans forward slightly as if you were walking uphill 3-4 steps till band feels taut ( MAKE SURE THE DOOR IS LOCKED AND WON'T OPEN)   Stepping backwards, lower heel slowly, carry trunk and hips back , leaning forward, front knee along 2-3 rd toe line    3 min  ____    Lat pull down   band on over the door    Stand with back facing away the door  mini squat   elbow by ribs "w"   Inhale down, exhale up  Pressing through 4 points of feet entire process  Knees behind toes on the way down      ___  hold off of push ups and pull ups for now  - alternatives to push ups   tricep dips off bench with feet under knees for perpendicular force on floor  , chin tuck , shoulders squeezing down and back   -alternatives to pull up  use the standing minisquat "w" band pull you learned today

## 2020-09-02 NOTE — Therapy (Signed)
[place] [facility] MAIN [facility] SERVICES [address] [place], [place], [address] Phone: [phone]   Fax:  [phone]  Physical Therapy Treatment  Patient Details  Name: Dorothy Owens MRN: [id number] Date of Birth: 1970-09-26 Referring Provider (PT): Christeen Douglas   Encounter Date: 09/01/2020   PT End of Session - 09/02/20 0945    Visit Number 9    Number of Visits 10    Date for PT Re-Evaluation 09/08/20    PT Start Time 1305    PT Stop Time 1405    PT Time Calculation (min) 60 min    Activity Tolerance Patient tolerated treatment well    Behavior During Therapy [facility] for tasks assessed/performed           No past medical history on file.  No past surgical history on file.  There were no vitals filed for this visit.   Subjective Assessment - 09/01/20 1313    Subjective Pt reported the L shoulder blade popping has increased and also the R front shoulder also is popping just standing. Pt went swing dancing. Pt reports knees hurt ( R >L) on the knee cap.    Pertinent History Hx of twisted ankles R and L,  fall on ice skating in 2013 on R buttock, TMJ, not perinmenopausal yet but periods are shorter    Limitations Lifting    Patient Stated Goals improving skeletal health, bone health and balance              [facility] PT Assessment - 09/02/20 1045      Observation/Other Assessments   Observations tricep dips with cue for feet placement, upper spine/ cervical retraction in tact      Palpation   SI assessment  FADDIR B with pain at inguinal area ( post Tx: no pain)  , hyomobile, tightness at IT band both attachments, adductors mm R > L                         [facility] Adult PT Treatment/Exercise - 09/02/20 1045      Ambulation/Gait   Gait Comments heel striking      Posture/Postural Control   Posture Comments hyperextension of knees      Therapeutic Activites    Other Therapeutic Activities modified workout routine:  advised to hold off o push up and pull ups      Neuro Re-ed    Neuro Re-ed Details  cued for deep core coordination with stretches to minimize lumbor lordosis, less heel striking in gait,      Manual Therapy   Manual therapy comments STM/MWM at problem areas noted in assessment                       PT Long Term Goals - 08/26/20 1147      PT LONG TERM GOAL #1   Title Pt will demo proper body mechanics to minimize straining of abdomen and pelvic floor with fitness routine (modifications to sit-up/ crunches) ( weight lifting)    Time 4    Period Weeks    Status On-going      PT LONG TERM GOAL #2   Title Pt will report no LBP occuring across across one month to continue with fitness and ADLs activities    Time 8    Period Weeks    Status On-going      PT LONG TERM GOAL #3   Title Pt  will make modifcations to her sitting posture/ work station at work in order to minimize overactivity of pelvic floor    Time 8    Period Weeks    Status Achieved      PT LONG TERM GOAL #4   Title Pt will demo proper deep core coordination with proper diaphragmatic excursion in order to optimize postural stability and pelvic floor function    Time 10    Period Weeks    Status On-going      PT LONG TERM GOAL #5   Title Pt will report no straining with bowel movements 100% of the time and improved boewl frequency from 3-4x/week to daily or every other day across 2 weeks in order to restore pelvic floor function    Time 6    Period Weeks    Status On-going      PT LONG TERM GOAL #6   Title Pt will demo decreased abdominal separation from 3 fingers width above umbilicus to < 1 fingers width and increased lateral diaphragmatic excursion in order to progress to proper pelvic floor lengthening to minimize pelvic pain and restore bowel movements and sexual function without difficulty    Time 10    Period Weeks    Status On-going      PT LONG TERM GOAL #7   Title Pt will report decreased  TMJ pain from 4/10 to < 1/10 and be IND with relaxation practices in order to increase QOL    Time 8    Period Weeks    Status On-going                 Plan - 09/02/20 0945    Clinical Impression Statement Addressed lumbar/ sacral area which showed tightness at sacrum and adductors and weakness of hip abduction mm. Manual Tx and neuromuscuar re-education helped to promote more hip ER, less hyperextension and also less heel striking in gait. Progressed scapulothoracic strengthening exercises and modified workout routine with advise to hold on push ups and pull ups due to importance of building up RTC mm and scapulothoracic deep mm strength first to minimize injuries. Provided alternative options that help strenghtening posterior mm and decrease upper trap overuse. Pt demo'd techniques correctly.  Pt continues to benefit from skilled PT.   Plan to assess lower kinetic chain and feet to minimize overuse of adductor mm and optimize pelvic floor mobility.     Personal Factors and Comorbidities Fitness;Other    Stability/Clinical Decision Making Evolving/Moderate complexity    Rehab Potential Good    PT Frequency 1x / week    PT Duration Other (comment)   10   PT Treatment/Interventions Balance training;Neuromuscular re-education;Gait training;Moist Heat;Functional mobility training;Therapeutic activities;Patient/family education;Manual techniques;Therapeutic exercise;Taping;Spinal Manipulations;Joint Manipulations;Scar mobilization;Stair training;Traction;Energy conservation    Consulted and Agree with Plan of Care Patient           Patient will benefit from skilled therapeutic intervention in order to improve the following deficits and impairments:  Increased muscle spasms,Decreased mobility,Decreased coordination,Decreased endurance,Decreased activity tolerance,Decreased range of motion,Decreased strength,Improper body mechanics,Pain,Postural dysfunction  Visit Diagnosis: Other muscle  spasm  Diastasis recti  Other lack of coordination  TMJ dysfunction  Chronic bilateral low back pain without sciatica     Problem List There are no problems to display for this patient.   Mariane Masters ,PT, DPT, E-RYT  09/02/2020, 10:50 AM   [facility] MAIN [facility] SERVICES [address] [place], [place], [address] Phone: [phone]   Fax:  [phone]  Name: Dorothy Owens MRN: [id number] Date of Birth: 12/04/1970

## 2020-09-08 ENCOUNTER — Other Ambulatory Visit: Payer: Self-pay

## 2020-09-08 ENCOUNTER — Ambulatory Visit: Payer: BC Managed Care – PPO | Admitting: Physical Therapy

## 2020-09-08 DIAGNOSIS — M26609 Unspecified temporomandibular joint disorder, unspecified side: Secondary | ICD-10-CM

## 2020-09-08 DIAGNOSIS — G8929 Other chronic pain: Secondary | ICD-10-CM

## 2020-09-08 DIAGNOSIS — M6208 Separation of muscle (nontraumatic), other site: Secondary | ICD-10-CM

## 2020-09-08 DIAGNOSIS — R278 Other lack of coordination: Secondary | ICD-10-CM

## 2020-09-08 DIAGNOSIS — M62838 Other muscle spasm: Secondary | ICD-10-CM

## 2020-09-08 DIAGNOSIS — M545 Low back pain, unspecified: Secondary | ICD-10-CM

## 2020-09-09 NOTE — Patient Instructions (Signed)
Plank Modifications to fitness class in place of push ups for now :   Dolphin plank with fists together, on bed height or against wall    Sideplank versions Forearm/ fist  Straight arm with wrist slightly ahead of shoulder    _Sideplank  Straight arm with wrist slightly ahead of shoulder  Bottom leg straight in line with tailbone,  Top knee bent, shin bone perpendicular to floor   Lower down at the end of holding it 5 breaths 10 count By bending bottom knee and hips scoots dow towards feet    ___  Birddog  Modified  - closed kinetic chain  Table top position,  6 points of contact: paw hands, knees hip width apart, toes tucked under  Shoulders down and back like squeezing armpits  Not sagging back   L arm up , thumbs up, arm is out like a half"V" shoulder blade slides down and back  R knee straight, toes tucked on the ground,  Lengthen whole spine as if yard stick is balanced on spine, chin tucked   L + R = 1 rep 10 reps   __   Lat pull down   band on over the door    Stand with back facing away the door  mini squat   elbow by ribs "w"  Inhale down, exhale up  Pressing through 4 points of feet entire process  Knees behind toes on the way down      30reps
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
## 2020-09-23 ENCOUNTER — Ambulatory Visit: Payer: BC Managed Care – PPO | Admitting: Physical Therapy

## 2020-09-23 ENCOUNTER — Other Ambulatory Visit: Payer: Self-pay

## 2020-09-23 DIAGNOSIS — G8929 Other chronic pain: Secondary | ICD-10-CM

## 2020-09-23 DIAGNOSIS — M62838 Other muscle spasm: Secondary | ICD-10-CM

## 2020-09-23 DIAGNOSIS — R278 Other lack of coordination: Secondary | ICD-10-CM

## 2020-09-23 DIAGNOSIS — M6208 Separation of muscle (nontraumatic), other site: Secondary | ICD-10-CM

## 2020-09-23 DIAGNOSIS — M26609 Unspecified temporomandibular joint disorder, unspecified side: Secondary | ICD-10-CM

## 2020-09-23 NOTE — Patient Instructions (Signed)
Try to switch to organic cotton underwear  Discontinue using body wash/ soaps over the vulva area  __  Finding anterior tilt of pelvis through points of contact with feet, Expand diaphragm, relax pelvic floor on inhalation before lifting weights   ___

## 2020-09-23 NOTE — Therapy (Signed)
[place] [facility] MAIN [facility] SERVICES [address] [place], [place], [address] Phone: [phone]   Fax:  [phone]  Physical Therapy Treatment  Patient Details  Name: Dorothy Owens MRN: [id number] Date of Birth: [birth year] Referring Provider (PT): Christeen Douglas   Encounter Date: 09/23/2020   PT End of Session - 09/23/20 1449    Visit Number 11    Date for PT Re-Evaluation 11/17/20    PT Start Time 1305    PT Stop Time 1415    PT Time Calculation (min) 70 min    Activity Tolerance Patient tolerated treatment well    Behavior During Therapy [facility] for tasks assessed/performed           No past medical history on file.  No past surgical history on file.  There were no vitals filed for this visit.   Subjective Assessment - 09/23/20 1309    Subjective Pt reported she noticed low back soreness last week. Pt noticed vaginal itchiness and she thinks it may be a latex allergy to condoms or allergy to lubricant. Pt will be seeing a vulvar dermatologist.    Pertinent History Hx of twisted ankles R and L,  fall on ice skating in 2013 on R buttock, TMJ, not perinmenopausal yet but periods are shorter    Limitations Lifting    Patient Stated Goals improving skeletal health, bone health and balance              [facility] PT Assessment - 09/23/20 1450      Coordination   Coordination and Movement Description more control of T/L junction in dolphin plank on wall,      Other:   Other/ Comments dumbbell lifting in hooklying : lumbar extensor compensation, elbow position with shoulder IR.      Palpation   SI assessment  levelled iliac crest, shoulders                      Pelvic Floor Special Questions - 09/23/20 1456    External Perineal Exam tightness at R suprapubic and anterior mm.  redness over inner thigh, pubic area, vulvar    Skin Integrity Intact   dull/ sharp sensory testing in tact            [facility] Adult PT  Treatment/Exercise - 09/23/20 1536      Therapeutic Activites    Other Therapeutic Activities explained slow down, pelvic tilt to minimize tightened pelvic floor mm./ shortened diaphragm mm and pacing with fitness routine, discussed vulvar hygiene: Try to switch to organic cotton underwear, discontinue using body wash/ soaps over the vulva area     Neuro Re-ed    Neuro Re-ed Details  excessive cues for diaphragm/ pelvic floor expansion with inhalation, anterior tilt and stabilization points in simulated dumbbell exercises      Manual Therapy   Manual therapy comments externally at pubic symphysis R, anterior mm of pelvic floor through undergarments / sheet                       PT Long Term Goals - 09/09/20 1604      PT LONG TERM GOAL #1   Title Pt will demo proper body mechanics to minimize straining of abdomen and pelvic floor with fitness routine (modifications to sit-up/ crunches) ( weight lifting)    Time 4    Period Weeks    Status On-going      PT LONG TERM GOAL #  2   Title Pt will report no LBP occuring across across one month to continue with fitness and ADLs activities    Time 8    Period Weeks    Status On-going      PT LONG TERM GOAL #3   Title Pt will make modifcations to her sitting posture/ work station at work in order to minimize overactivity of pelvic floor    Time 8    Period Weeks    Status Achieved      PT LONG TERM GOAL #4   Title Pt will demo proper deep core coordination with proper diaphragmatic excursion in order to optimize postural stability and pelvic floor function    Time 10    Period Weeks    Status On-going      PT LONG TERM GOAL #5   Title Pt will report no straining with bowel movements 100% of the time and improved boewl frequency from 3-4x/week to daily or every other day across 2 weeks in order to restore pelvic floor function    Time 6    Period Weeks    Status On-going      Additional Long Term Goals   Additional Long  Term Goals Yes      PT LONG TERM GOAL #6   Title Pt will demo decreased abdominal separation from 3 fingers width above umbilicus to < 1 fingers width and increased lateral diaphragmatic excursion in order to progress to proper pelvic floor lengthening to minimize pelvic pain and restore bowel movements and sexual function without difficulty    Time 10    Period Weeks    Status Achieved      PT LONG TERM GOAL #7   Title Pt will report decreased TMJ pain from 4/10 to < 1/10 and be IND with relaxation practices in order to increase QOL    Time 8    Period Weeks    Status On-going      PT LONG TERM GOAL #8   Title Pt will demo modifications to fitness routine with IND and proper alignment to strengthen underused mm systems to minimize risk for injuries    Time 10    Period Weeks    Status New    Target Date 11/18/20                 Plan - 09/23/20 1450    Clinical Impression Statement Pt is making progress and demonstrated less cues for T/L junction hypomobility but required excessive cues for anterior tilt of pelvis and unlearning abdominal bracing technique in ehr workout routines which reduces natural lumbar lordosis. Causing pelvic posterior tilt and tightening of pelvic floor mm.  Reviewed past HEP with cues for maintaining spinal curves and not posterior tilt of pelvis, cues for cervicothoracic strengthening.  External manual Tx was applied to minimize R suprapuic and pelvic floor mm tightness.    Assessed vulvar region which had redness along inner thighs as well. Sensory testing was intact. Withheld internal assessment until pt has seen her dermatologist. Recommended vulvar hygiene: Try to switch to organic cotton underwear, discontinue using body wash/ soaps over the vulva area. Pt voiced understanding.   Pt continues to benefit from skilled PT   Personal Factors and Comorbidities Fitness;Other    Stability/Clinical Decision Making Evolving/Moderate complexity    Rehab  Potential Good    PT Frequency 1x / week    PT Duration Other (comment)   10   PT Treatment/Interventions Balance training;Neuromuscular  re-education;Gait training;Moist Heat;Functional mobility training;Therapeutic activities;Patient/family education;Manual techniques;Therapeutic exercise;Taping;Spinal Manipulations;Joint Manipulations;Scar mobilization;Stair training;Traction;Energy conservation    Consulted and Agree with Plan of Care Patient           Patient will benefit from skilled therapeutic intervention in order to improve the following deficits and impairments:  Increased muscle spasms,Decreased mobility,Decreased coordination,Decreased endurance,Decreased activity tolerance,Decreased range of motion,Decreased strength,Improper body mechanics,Pain,Postural dysfunction  Visit Diagnosis: Diastasis recti  Other lack of coordination  Other muscle spasm  TMJ dysfunction  Chronic bilateral low back pain without sciatica     Problem List There are no problems to display for this patient.   Mariane Masters ,PT, DPT, E-RYT  09/23/2020, 3:46 PM  [place] [facility] MAIN [facility] SERVICES [address] [place], [place], [address] Phone: [phone]   Fax:  [phone]  Name: Danaly Bari MRN: [id number] Date of Birth: Aug 24, 1970

## 2020-10-05 ENCOUNTER — Encounter: Payer: BC Managed Care – PPO | Admitting: Physical Therapy

## 2020-10-12 ENCOUNTER — Ambulatory Visit: Payer: BC Managed Care – PPO | Attending: Obstetrics and Gynecology | Admitting: Physical Therapy

## 2020-10-12 ENCOUNTER — Other Ambulatory Visit: Payer: Self-pay

## 2020-10-12 DIAGNOSIS — M26609 Unspecified temporomandibular joint disorder, unspecified side: Secondary | ICD-10-CM

## 2020-10-12 DIAGNOSIS — M62838 Other muscle spasm: Secondary | ICD-10-CM

## 2020-10-12 DIAGNOSIS — M545 Low back pain, unspecified: Secondary | ICD-10-CM | POA: Diagnosis present

## 2020-10-12 DIAGNOSIS — G8929 Other chronic pain: Secondary | ICD-10-CM | POA: Diagnosis present

## 2020-10-12 DIAGNOSIS — R278 Other lack of coordination: Secondary | ICD-10-CM | POA: Diagnosis present

## 2020-10-12 DIAGNOSIS — M6208 Separation of muscle (nontraumatic), other site: Secondary | ICD-10-CM | POA: Insufficient documentation

## 2020-10-12 DIAGNOSIS — M533 Sacrococcygeal disorders, not elsewhere classified: Secondary | ICD-10-CM | POA: Diagnosis not present

## 2020-10-12 NOTE — Patient Instructions (Addendum)
Stretch for pelvic floor   V- slides  "v heels slide away and then back toward buttocks and then rock knee to slight ,  slide heel along at 11 o clock away from buttocks   10 reps     On belly: Riding horse edge of mattress  knee bent like riding a horse, move knee towards armpit and out  10 reps   Mermaid stretch  Rocking while seated on the floor with heels to one side of the hip Heels to one side of the hip  Rock forward towards the knee that is bent , rock beck towards the opposite sitting bones   __  Principles of stability points in positions   ___  Fitness : J scoop breath in vertical plane  Hands at low abd and top abd  Inhale/ exhale quietly not in chest

## 2020-10-12 NOTE — Therapy (Signed)
[place] [facility] MAIN [facility] SERVICES [address] [place], [place], [address] Phone: [phone]   Fax:  [phone]  Physical Therapy Treatment  Patient Details  Name: Dorothy Owens MRN: [id number] Date of Birth: [birth year] Referring Provider (PT): Christeen Douglas   Encounter Date: 10/12/2020   PT End of Session - 10/12/20 1426     Visit Number 12    Date for PT Re-Evaluation 11/17/20    PT Start Time 1335    PT Stop Time 1430    PT Time Calculation (min) 55 min    Activity Tolerance Patient tolerated treatment well    Behavior During Therapy [facility] for tasks assessed/performed             No past medical history on file.  No past surgical history on file.  There were no vitals filed for this visit.   Subjective Assessment - 10/12/20 1340     Subjective Pt used diaper cream for the vulvar itchiness with a vulvar dermatologist in [place] this week. Pt noticed tightness of opening of pelvic floor with intercourse and there is swelling sensation from friction and it gives her the sensation to pee.  Pt has modified her plank to forearms. The slipping sensation by L shoulder blade has occurred less. Pt noticed movement was easier with lunges.    Pertinent History Hx of twisted ankles R and L,  fall on ice skating in 2013 on R buttock, TMJ, not perinmenopausal yet but periods are shorter    Limitations Lifting    Patient Stated Goals improving skeletal health, bone health and balance                [facility] PT Assessment - 10/12/20 1343       Observation/Other Assessments   Observations shoulders and pelvic girdle levelled      Other:   Other/ Comments dolphin plank by wall with less T/L hypombility , minor cues                        Pelvic Floor Special Questions - 10/12/20 1847     External Perineal Exam tightness at B obt int    Skin Integrity --   less widespread redness, more localized at perineum               [facility] Adult PT Treatment/Exercise - 10/12/20 1848       Therapeutic Activites    Other Therapeutic Activities explained principles of stability , pelvic floor stretches for pelvic floor functions, showed anatomy/ physiology pictures      Neuro Re-ed    Neuro Re-ed Details  cued for pelvic floor stretches and squats/ kettle bell swing technique to minimize shoulder / knee pain, cued for technique of dolphin plank, less cues for T/L hypomobility                         PT Long Term Goals - 10/12/20 1852       PT LONG TERM GOAL #1   Title Pt will demo proper body mechanics to minimize straining of abdomen and pelvic floor with fitness routine (modifications to sit-up/ crunches) ( weight lifting)    Time 4    Period Weeks    Status On-going      PT LONG TERM GOAL #2   Title Pt will report no LBP occuring across across one month to continue with fitness and ADLs activities  Time 8    Period Weeks    Status On-going      PT LONG TERM GOAL #3   Title Pt will make modifcations to her sitting posture/ work station at work in order to minimize overactivity of pelvic floor    Time 8    Period Weeks    Status Achieved      PT LONG TERM GOAL #4   Title Pt will demo proper deep core coordination with proper diaphragmatic excursion in order to optimize postural stability and pelvic floor function    Time 10    Period Weeks    Status On-going      PT LONG TERM GOAL #5   Title Pt will report no straining with bowel movements 100% of the time and improved boewl frequency from 3-4x/week to daily or every other day across 2 weeks in order to restore pelvic floor function    Time 6    Period Weeks    Status On-going      PT LONG TERM GOAL #6   Title Pt will demo decreased abdominal separation from 3 fingers width above umbilicus to < 1 fingers width and increased lateral diaphragmatic excursion in order to progress to proper pelvic floor lengthening to minimize pelvic  pain and restore bowel movements and sexual function without difficulty    Time 10    Period Weeks    Status Achieved      PT LONG TERM GOAL #7   Title Pt will report decreased TMJ pain from 4/10 to < 1/10 and be IND with relaxation practices in order to increase QOL    Time 8    Period Weeks    Status On-going      PT LONG TERM GOAL #8   Title Pt will demo modifications to fitness routine with IND and proper alignment to strengthen underused mm systems to minimize risk for injuries    Time 10    Period Weeks    Status On-going                   Plan - 10/12/20 1426     Clinical Impression Statement Pt demonstrates improved spinal stability, less hypermobility of thoracic spine and also reports less shoulder pain and thoracic rib hypermobility which indicates the effectiveness of RTC  and scapulothoracolumbar strengthening, modifications to her weight lifting routine, and neuromuscular reeducation.   Focused on pelvic floor related goals today and pt showed less pelvic floor mm tightness at anterior mm but had tightness at obturator internus bilaterally. Pt demo'd increased pelvic floor lengthening post Tx which will help with bowel and sexual function.  Pt's vulva redness also decreased and only was present at perineum region instead of being more widespread. Pt has been using a diaper rash cream she heard about  from her sister and also switched to cotton underwear instead of synthetic materials as recommended.  Pt will be seeing a vulvar dermatologist this week.    Pt continues to benefit from skilled PT. Plan to modify her workout routine to promote more hip mobility.    Personal Factors and Comorbidities Fitness;Other    Stability/Clinical Decision Making Evolving/Moderate complexity    Clinical Decision Making Moderate    Rehab Potential Good    PT Frequency 1x / week    PT Duration Other (comment)   10   PT Treatment/Interventions Balance training;Neuromuscular  re-education;Gait training;Moist Heat;Functional mobility training;Therapeutic activities;Patient/family education;Manual techniques;Therapeutic exercise;Taping;Spinal Manipulations;Joint Manipulations;Scar mobilization;Stair training;Traction;Energy conservation  Consulted and Agree with Plan of Care Patient             Patient will benefit from skilled therapeutic intervention in order to improve the following deficits and impairments:  Increased muscle spasms, Decreased mobility, Decreased coordination, Decreased endurance, Decreased activity tolerance, Decreased range of motion, Decreased strength, Improper body mechanics, Pain, Postural dysfunction  Visit Diagnosis: Diastasis recti  Other muscle spasm  Other lack of coordination  TMJ dysfunction  Chronic bilateral low back pain without sciatica     Problem List There are no problems to display for this patient.   Mariane Masters ,PT, DPT, E-RYT  10/12/2020, 6:53 PM  [place] [facility] MAIN [facility] SERVICES [address] [place], [place], [address] Phone: [phone]   Fax:  [phone]  Name: Avamae Dehaan MRN: [id number] Date of Birth: July 24, 1970

## 2020-10-18 ENCOUNTER — Other Ambulatory Visit: Payer: Self-pay

## 2020-10-18 ENCOUNTER — Ambulatory Visit: Payer: BC Managed Care – PPO | Admitting: Physical Therapy

## 2020-10-18 DIAGNOSIS — R278 Other lack of coordination: Secondary | ICD-10-CM

## 2020-10-18 DIAGNOSIS — M62838 Other muscle spasm: Secondary | ICD-10-CM

## 2020-10-18 DIAGNOSIS — M545 Low back pain, unspecified: Secondary | ICD-10-CM

## 2020-10-18 DIAGNOSIS — M6208 Separation of muscle (nontraumatic), other site: Secondary | ICD-10-CM | POA: Diagnosis not present

## 2020-10-18 DIAGNOSIS — M26609 Unspecified temporomandibular joint disorder, unspecified side: Secondary | ICD-10-CM

## 2020-10-18 DIAGNOSIS — G8929 Other chronic pain: Secondary | ICD-10-CM

## 2020-10-18 NOTE — Patient Instructions (Signed)
1)  Scoot hips to R,  rock knees side to side   2)  Back to the midline with hips:  Ear to shoulder and then rotate to L armpit, R arm is moving like angel wings

## 2020-10-19 NOTE — Therapy (Signed)
[name] [address] [place], [place], [address] Phone: [phone]   Fax:  [phone]  Physical Therapy Treatment  Patient Details  Name: Dorothy Owens MRN: [id number] Date of Birth: Jul 10, 1970 Referring Provider (PT): Christeen Douglas   Encounter Date: 10/18/2020   PT End of Session - 10/18/20 1715     Visit Number 13    Date for PT Re-Evaluation 11/17/20    PT Start Time 1303    PT Stop Time 1400    PT Time Calculation (min) 57 min    Activity Tolerance Patient tolerated treatment well    Behavior During Therapy [facility] for tasks assessed/performed             No past medical history on file.  No past surgical history on file.  There were no vitals filed for this visit.   Subjective Assessment - 10/18/20 1304     Subjective Pt reported she saw her vulvar dermatologist. Pt is waiting for her culture test results. Pt is taking new creams for the vulvar irritation and it is starting to feel better. 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    Pertinent History Hx of twisted ankles R and L,  fall on ice skating in 2013 on R buttock, TMJ, not perinmenopausal yet but periods are shorter    Limitations Lifting    Patient Stated Goals improving skeletal health, bone health and balance                [facility] PT Assessment - 10/18/20 1314       Observation/Other Assessments   Observations L shoulder slightly lowered, B iliaccrest      Palpation   Spinal mobility R medial attachments of scapula, L levator scapula mm tightness , hypomobile T12-L1                           [facility] Adult PT Treatment/Exercise - 10/18/20 1351       Neuro Re-ed    Neuro Re-ed Details  cued for sideflexion/ rotation to address shoulder/thoracic tightness      Moist Heat Therapy   Number Minutes Moist Heat 5 Minutes    Moist Heat Location --   thoracic , in L thoracic  rotation with heat pack from R sidelying     Manual Therapy   Manual therapy comments PA mob T12-L1, STM/MWM at areas noted in assessment                         PT Long Term Goals - 10/12/20 1852       PT LONG TERM GOAL #1   Title Pt will demo proper body mechanics to minimize straining of abdomen and pelvic floor with fitness routine (modifications to sit-up/ crunches) ( weight lifting)    Time 4    Period Weeks    Status On-going      PT LONG TERM GOAL #2   Title Pt will report no LBP occuring across across one month to continue with fitness and ADLs activities    Time 8    Period Weeks    Status On-going      PT LONG TERM GOAL #3   Title Pt will make modifcations to her sitting posture/ work station at work in order to minimize overactivity of pelvic floor    Time 8    Period  Weeks    Status Achieved      PT LONG TERM GOAL #4   Title Pt will demo proper deep core coordination with proper diaphragmatic excursion in order to optimize postural stability and pelvic floor function    Time 10    Period Weeks    Status On-going      PT LONG TERM GOAL #5   Title Pt will report no straining with bowel movements 100% of the time and improved boewl frequency from 3-4x/week to daily or every other day across 2 weeks in order to restore pelvic floor function    Time 6    Period Weeks    Status On-going      PT LONG TERM GOAL #6   Title Pt will demo decreased abdominal separation from 3 fingers width above umbilicus to < 1 fingers width and increased lateral diaphragmatic excursion in order to progress to proper pelvic floor lengthening to minimize pelvic pain and restore bowel movements and sexual function without difficulty    Time 10    Period Weeks    Status Achieved      PT LONG TERM GOAL #7   Title Pt will report decreased TMJ pain from 4/10 to < 1/10 and be IND with relaxation practices in order to increase QOL    Time 8    Period Weeks    Status On-going       PT LONG TERM GOAL #8   Title Pt will demo modifications to fitness routine with IND and proper alignment to strengthen underused mm systems to minimize risk for injuries    Time 10    Period Weeks    Status On-going                   Plan - 10/18/20 1716     Clinical Impression Statement Pt required manual Tx to minimize tightness along thoracic paraspinals and intercostals to correct a slight rotation. Pt required cues for optimal excursion of diaphragm. Plan to assess pelvic floor again next session.  Vulva irritation is improving with the recommendations made by her dermatologist. 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  Pt continues to benefit from skilled PT.    Personal Factors and Comorbidities Fitness;Other    Stability/Clinical Decision Making Evolving/Moderate complexity    Rehab Potential Good    PT Frequency 1x / week    PT Duration Other (comment)   10   PT Treatment/Interventions Balance training;Neuromuscular re-education;Gait training;Moist Heat;Functional mobility training;Therapeutic activities;Patient/family education;Manual techniques;Therapeutic exercise;Taping;Spinal Manipulations;Joint Manipulations;Scar mobilization;Stair training;Traction;Energy conservation    Consulted and Agree with Plan of Care Patient             Patient will benefit from skilled therapeutic intervention in order to improve the following deficits and impairments:  Increased muscle spasms, Decreased mobility, Decreased coordination, Decreased endurance, Decreased activity tolerance, Decreased range of motion, Decreased strength, Improper body mechanics, Pain, Postural dysfunction  Visit Diagnosis: Other muscle spasm  Other lack of coordination  TMJ dysfunction  Chronic bilateral low back pain without sciatica     Problem List There are no problems to display for this patient.   Mariane Masters  ,PT, DPT, E-RYT  10/19/2020, 8:57 AM  [facility] MAIN [facility] SERVICES [address] [place], [place], [address] Phone: [phone]   Fax:  [phone]  Name: Dorothy Owens MRN: [id number] Date of Birth: Nov 24, 1970

## 2020-10-25 ENCOUNTER — Other Ambulatory Visit: Payer: Self-pay

## 2020-10-25 ENCOUNTER — Ambulatory Visit: Payer: BC Managed Care – PPO | Admitting: Physical Therapy

## 2020-10-25 DIAGNOSIS — M6208 Separation of muscle (nontraumatic), other site: Secondary | ICD-10-CM

## 2020-10-25 DIAGNOSIS — M26609 Unspecified temporomandibular joint disorder, unspecified side: Secondary | ICD-10-CM

## 2020-10-25 DIAGNOSIS — R278 Other lack of coordination: Secondary | ICD-10-CM

## 2020-10-25 DIAGNOSIS — M62838 Other muscle spasm: Secondary | ICD-10-CM

## 2020-10-25 DIAGNOSIS — G8929 Other chronic pain: Secondary | ICD-10-CM

## 2020-10-25 NOTE — Therapy (Signed)
[facility] MAIN [facility] SERVICES [address] [place], [place], [address] Phone: [phone]   Fax:  [phone]  Physical Therapy Treatment  Patient Details  Name: Dorothy Owens MRN: [id number] Date of Birth: 1971/02/08 Referring Provider (PT): Christeen Douglas   Encounter Date: 10/25/2020   PT End of Session - 10/25/20 1331     Visit Number 14    Date for PT Re-Evaluation 11/17/20    PT Start Time 1303    PT Stop Time 1400    PT Time Calculation (min) 57 min    Activity Tolerance Patient tolerated treatment well    Behavior During Therapy [facility] for tasks assessed/performed             No past medical history on file.  No past surgical history on file.  There were no vitals filed for this visit.   Subjective Assessment - 10/25/20 1314     Subjective Pt reported she had some midback tensions. Pt had stressful news and did not go workout this weekend and noticed she needs her workout routine to get her endorphins.  Pt noticed her vulvar irritation is 80-90% better and continues to use her MD's prescription creams    Pertinent History Hx of twisted ankles R and L,  fall on ice skating in 2013 on R buttock, TMJ, not perinmenopausal yet but periods are shorter    Limitations Lifting    Patient Stated Goals improving skeletal health, bone health and balance                [facility] PT Assessment - 10/25/20 1439       Floor to Stand   Comments half kneeling without toe extension in back leg,      Other:   Other/Comments back ward lunges with perturbation and poor propioception      Palpation   Spinal mobility paraspinal mm tightness R > L, limited midthoracic mobility, hypoer mobility at T/L junction but less cues for less lordosis                           [facility] Adult PT Treatment/Exercise - 10/25/20 1441       Therapeutic Activites    Other Therapeutic Activities guided relaxation over towel at midback to  optimize throacic extension. cervical retraction / scapular depression.      Neuro Re-ed    Neuro Re-ed Details  cued for optimal function of spine      Manual Therapy   Manual therapy comments STM/MWM to minmize paraspinal mm tightness                         PT Long Term Goals - 10/12/20 1852       PT LONG TERM GOAL #1   Title Pt will demo proper body mechanics to minimize straining of abdomen and pelvic floor with fitness routine (modifications to sit-up/ crunches) ( weight lifting)    Time 4    Period Weeks    Status On-going      PT LONG TERM GOAL #2   Title Pt will report no LBP occuring across across one month to continue with fitness and ADLs activities    Time 8    Period Weeks    Status On-going      PT LONG TERM GOAL #3   Title Pt will make modifcations to her sitting posture/ work station at work in  order to minimize overactivity of pelvic floor    Time 8    Period Weeks    Status Achieved      PT LONG TERM GOAL #4   Title Pt will demo proper deep core coordination with proper diaphragmatic excursion in order to optimize postural stability and pelvic floor function    Time 10    Period Weeks    Status On-going      PT LONG TERM GOAL #5   Title Pt will report no straining with bowel movements 100% of the time and improved boewl frequency from 3-4x/week to daily or every other day across 2 weeks in order to restore pelvic floor function    Time 6    Period Weeks    Status On-going      PT LONG TERM GOAL #6   Title Pt will demo decreased abdominal separation from 3 fingers width above umbilicus to < 1 fingers width and increased lateral diaphragmatic excursion in order to progress to proper pelvic floor lengthening to minimize pelvic pain and restore bowel movements and sexual function without difficulty    Time 10    Period Weeks    Status Achieved      PT LONG TERM GOAL #7   Title Pt will report decreased TMJ pain from 4/10 to < 1/10 and be  IND with relaxation practices in order to increase QOL    Time 8    Period Weeks    Status On-going      PT LONG TERM GOAL #8   Title Pt will demo modifications to fitness routine with IND and proper alignment to strengthen underused mm systems to minimize risk for injuries    Time 10    Period Weeks    Status On-going                   Plan - 10/25/20 1332     Clinical Impression Statement Pt required cues for alignment and technique in half kneeling and lunges to adjust for her poor stability and cues for hypermobility in UE in downward facing dog. Pt was advanced to resistance band training to promote less T/ L hypermobility and standing exercise was modified to double kneeling position with more emphasis of posterior chain of mm due to pt reporting upper trap tightness in other technique. Pt continues to require manual Tx to minimize paraspinals and plan to further address C/T and anterior neck area to help minimize TMJ. Plan to return to pelvic floor issues after pt has reported complete resolution of vulvar irritation is improving with prescription creams provided by a dermatologist.  Although spinal deviations remain absent but there is a slight posterior rotation at T/ L junction still which will be addressed again next session.   Pt continues to benefit from skilled PT.    Personal Factors and Comorbidities Fitness;Other    Stability/Clinical Decision Making Evolving/Moderate complexity    Rehab Potential Good    PT Frequency 1x / week    PT Duration Other (comment)   10   PT Treatment/Interventions Balance training;Neuromuscular re-education;Gait training;Moist Heat;Functional mobility training;Therapeutic activities;Patient/family education;Manual techniques;Therapeutic exercise;Taping;Spinal Manipulations;Joint Manipulations;Scar mobilization;Stair training;Traction;Energy conservation    Consulted and Agree with Plan of Care Patient             Patient will  benefit from skilled therapeutic intervention in order to improve the following deficits and impairments:  Increased muscle spasms, Decreased mobility, Decreased coordination, Decreased endurance, Decreased activity tolerance, Decreased range  of motion, Decreased strength, Improper body mechanics, Pain, Postural dysfunction  Visit Diagnosis: Other muscle spasm  Other lack of coordination  TMJ dysfunction  Chronic bilateral low back pain without sciatica  Diastasis recti     Problem List There are no problems to display for this patient.   Mariane Masters ,PT, DPT, E-RYT  10/25/2020, 5:49 PM  [place] MAIN [facility] SERVICES [address] [place], [place], [address] Phone: [phone]   Fax:  [phone]  Name: Laruen Risser MRN: [id number] Date of Birth: [birth year]

## 2020-10-25 NOTE — Patient Instructions (Addendum)
half kneeling transitions to and from the floor  Add mini standing lunges with hand on dresser or table  20 x reps   Start with ski track stance, step back with heel/ toes turned straight, front knee above ankle without moving as your back knee bends and straightens  Keep pelvis and trunk centered between legs without shifting more to the front legs      Transition from standing to floor :  stand to floor transfer :      _ slow     _ mini squat      _ crawl down with one hand on thigh      _downward dog  - >  shoulders down and back-  walk the dog ( knee bents to lengthe hamstrings)      Floor to stand :   downward dog   crawl hands back, butt is back, knees behind toes -> squat  Hands at waist , elbows back, chest lifts    ____   Double kneeling with therabands under door   - towel under knees    Toes tucked under    - sit on heels, exhale pull bands past pockets  20 reps   ___________________________________________________  Toning to allow your exhalation to bring ribcage down towards pelvis  (inhale: inflate into the back, front, left/right so the ribcage expands like tree ring)   Rest your hands at the ribcage to sense the movement downward as you make these sounds long and smooth  "ng" as in "sing"  Visualize violet at the crown of the head  "Mmmm"  Visualize indigo between eye brows   "Eee"Visualize blue at the throat  "A" as "Play"  Visualize green at the heart    "ah" as in "laaaa"     Visualize yellow at abdomen  "Oooo" as "Do" Visualize orange at the sexual organs  "O " as "Tote"  Visualize red at the [employer] with body scan   This exercise will help with moving the diaphragm at its full range for peristalsis

## 2020-11-03 ENCOUNTER — Ambulatory Visit: Payer: BC Managed Care – PPO | Attending: Obstetrics and Gynecology | Admitting: Physical Therapy

## 2020-11-03 ENCOUNTER — Other Ambulatory Visit: Payer: Self-pay

## 2020-11-03 DIAGNOSIS — R278 Other lack of coordination: Secondary | ICD-10-CM | POA: Diagnosis present

## 2020-11-03 DIAGNOSIS — M545 Low back pain, unspecified: Secondary | ICD-10-CM | POA: Insufficient documentation

## 2020-11-03 DIAGNOSIS — M62838 Other muscle spasm: Secondary | ICD-10-CM | POA: Insufficient documentation

## 2020-11-03 DIAGNOSIS — M6208 Separation of muscle (nontraumatic), other site: Secondary | ICD-10-CM | POA: Insufficient documentation

## 2020-11-03 DIAGNOSIS — M26609 Unspecified temporomandibular joint disorder, unspecified side: Secondary | ICD-10-CM | POA: Diagnosis present

## 2020-11-03 DIAGNOSIS — G8929 Other chronic pain: Secondary | ICD-10-CM | POA: Insufficient documentation

## 2020-11-03 DIAGNOSIS — M533 Sacrococcygeal disorders, not elsewhere classified: Secondary | ICD-10-CM | POA: Diagnosis not present

## 2020-11-04 NOTE — Therapy (Signed)
[place] [facility] MAIN [facility] SERVICES [address] [place], [place], [address] Phone: [phone]   Fax:  [phone]  Physical Therapy Treatment  Patient Details  Name: Dorothy Owens MRN: [id number] Date of Birth: 05-30-1970 Referring Provider (PT): Christeen Douglas   Encounter Date: 11/03/2020   PT End of Session - 11/04/20 1455     Visit Number 15    Date for PT Re-Evaluation 11/17/20    PT Start Time 1305    PT Stop Time 1400    PT Time Calculation (min) 55 min    Activity Tolerance Patient tolerated treatment well    Behavior During Therapy [facility] for tasks assessed/performed             No past medical history on file.  No past surgical history on file.  There were no vitals filed for this visit.   Subjective Assessment - 11/03/20 1311     Subjective Pt had no problesm with new exercises. Her knees wanted to pop in the one on her knees when she was sitting on her knees.    Pertinent History Hx of twisted ankles R and L,  fall on ice skating in 2013 on R buttock, TMJ, not perinmenopausal yet but periods are shorter    Limitations Lifting    Patient Stated Goals improving skeletal health, bone health and balance                [facility] PT Assessment - 11/04/20 1455       Coordination   Coordination and Movement Description upper trap, chest breathing      Palpation   Palpation comment L adductor/ lateral leg tightness, limited DV/EF mobility                           [facility] Adult PT Treatment/Exercise - 11/04/20 1456       Neuro Re-ed    Neuro Re-ed Details  cued for standing propiocpetion of COM over transverse arch to minimize supination. WBing through heels and optimize deep core coordination      Moist Heat Therapy   Number Minutes Moist Heat 5 Minutes      Manual Therapy   Manual therapy comments STM/MWM at problem areas noted in assessment                         PT Long Term  Goals - 10/12/20 1852       PT LONG TERM GOAL #1   Title Pt will demo proper body mechanics to minimize straining of abdomen and pelvic floor with fitness routine (modifications to sit-up/ crunches) ( weight lifting)    Time 4    Period Weeks    Status On-going      PT LONG TERM GOAL #2   Title Pt will report no LBP occuring across across one month to continue with fitness and ADLs activities    Time 8    Period Weeks    Status On-going      PT LONG TERM GOAL #3   Title Pt will make modifcations to her sitting posture/ work station at work in order to minimize overactivity of pelvic floor    Time 8    Period Weeks    Status Achieved      PT LONG TERM GOAL #4   Title Pt will demo proper deep core coordination with proper diaphragmatic excursion in order to optimize  postural stability and pelvic floor function    Time 10    Period Weeks    Status On-going      PT LONG TERM GOAL #5   Title Pt will report no straining with bowel movements 100% of the time and improved boewl frequency from 3-4x/week to daily or every other day across 2 weeks in order to restore pelvic floor function    Time 6    Period Weeks    Status On-going      PT LONG TERM GOAL #6   Title Pt will demo decreased abdominal separation from 3 fingers width above umbilicus to < 1 fingers width and increased lateral diaphragmatic excursion in order to progress to proper pelvic floor lengthening to minimize pelvic pain and restore bowel movements and sexual function without difficulty    Time 10    Period Weeks    Status Achieved      PT LONG TERM GOAL #7   Title Pt will report decreased TMJ pain from 4/10 to < 1/10 and be IND with relaxation practices in order to increase QOL    Time 8    Period Weeks    Status On-going      PT LONG TERM GOAL #8   Title Pt will demo modifications to fitness routine with IND and proper alignment to strengthen underused mm systems to minimize risk for injuries    Time 10     Period Weeks    Status On-going                   Plan - 11/04/20 1455     Clinical Impression Statement Pt required manual Tx to address L adductor/ lateral leg tightness, limited DV/EF mobility.   Required cues for standing propioception of COM over transverse arch to minimize supination,WBing through heels and optimize deep core coordination.  Today's session will help pt realign her posture.  Pt continues to benefit from skilled PT.     Personal Factors and Comorbidities Fitness;Other    Stability/Clinical Decision Making Evolving/Moderate complexity    Rehab Potential Good    PT Frequency 1x / week    PT Duration Other (comment)   10   PT Treatment/Interventions Balance training;Neuromuscular re-education;Gait training;Moist Heat;Functional mobility training;Therapeutic activities;Patient/family education;Manual techniques;Therapeutic exercise;Taping;Spinal Manipulations;Joint Manipulations;Scar mobilization;Stair training;Traction;Energy conservation    Consulted and Agree with Plan of Care Patient             Patient will benefit from skilled therapeutic intervention in order to improve the following deficits and impairments:  Increased muscle spasms, Decreased mobility, Decreased coordination, Decreased endurance, Decreased activity tolerance, Decreased range of motion, Decreased strength, Improper body mechanics, Pain, Postural dysfunction  Visit Diagnosis: Sacrococcygeal disorders, not elsewhere classified     Problem List There are no problems to display for this patient.   Mariane Masters ,PT, DPT, E-RYT  11/04/2020, 2:59 PM  [facility] MAIN [facility] SERVICES [address] [place], [place], [address] Phone: [phone]   Fax:  [phone]  Name: Dorothy Owens MRN: [id number] Date of Birth: 01/17/1971

## 2020-11-04 NOTE — Patient Instructions (Signed)
standing propiocpetion of COM over transverse arch   minimize supination and less weight in heels and optimize deep core coordination

## 2020-11-08 ENCOUNTER — Other Ambulatory Visit: Payer: Self-pay

## 2020-11-08 ENCOUNTER — Ambulatory Visit: Payer: BC Managed Care – PPO | Admitting: Physical Therapy

## 2020-11-08 DIAGNOSIS — M533 Sacrococcygeal disorders, not elsewhere classified: Secondary | ICD-10-CM | POA: Diagnosis not present

## 2020-11-08 DIAGNOSIS — M62838 Other muscle spasm: Secondary | ICD-10-CM

## 2020-11-08 DIAGNOSIS — M26609 Unspecified temporomandibular joint disorder, unspecified side: Secondary | ICD-10-CM

## 2020-11-08 DIAGNOSIS — G8929 Other chronic pain: Secondary | ICD-10-CM

## 2020-11-08 DIAGNOSIS — R278 Other lack of coordination: Secondary | ICD-10-CM

## 2020-11-08 DIAGNOSIS — M6208 Separation of muscle (nontraumatic), other site: Secondary | ICD-10-CM

## 2020-11-08 NOTE — Therapy (Signed)
[place] [facility] MAIN [facility] SERVICES [address] [place], [place], [address] Phone: [phone]   Fax:  [phone]  Physical Therapy Treatment  Patient Details  Name: Dorothy Owens MRN: [id number] Date of Birth: March 19, 1971 Referring Provider (PT): Christeen Douglas   Encounter Date: 11/08/2020   PT End of Session - 11/08/20 1638     Visit Number 16    Date for PT Re-Evaluation 11/17/20    PT Start Time 1303    PT Stop Time 1410    PT Time Calculation (min) 67 min    Activity Tolerance Patient tolerated treatment well    Behavior During Therapy [facility] for tasks assessed/performed             No past medical history on file.  No past surgical history on file.  There were no vitals filed for this visit.   Subjective Assessment - 11/08/20 1410     Subjective Pt is still noticing the catch in her L lateral shoulder blade. Pt has completed her vaginal cream and medication. Vaginal itchiness ihas resolved    Pertinent History Hx of twisted ankles R and L,  fall on ice skating in 2013 on R buttock, TMJ, not perinmenopausal yet but periods are shorter    Limitations Lifting    Patient Stated Goals improving skeletal health, bone health and balance                [facility] PT Assessment - 11/08/20 1412       Other:   Other/Comments push ups on knees with proper scapulothoracic stability and no increased lumbar lordosis, hyperextended elbows at the rise   Poor disassociation with thoracic extensions without lumbar lordosis and excessive use of C1-2.      Palpation   Spinal mobility interspinal cervico/thoracic L tightness    SI assessment  tightness at R SIJ      Ambulation/Gait   Gait Comments limited posterior rotation of R hips,                           [facility] Adult PT Treatment/Exercise - 11/08/20 1412       Neuro Re-ed    Neuro Re-ed Details  cued for more low abdominal expansion, less chest breathing,  depression of ribcage / scapula with exhalation,  cervical retraction with connection to thoracic extension.      Manual Therapy   Manual therapy comments STM/MWM/ rocking  at problem areas noted in assessment                         PT Long Term Goals - 10/12/20 1852       PT LONG TERM GOAL #1   Title Pt will demo proper body mechanics to minimize straining of abdomen and pelvic floor with fitness routine (modifications to sit-up/ crunches) ( weight lifting)    Time 4    Period Weeks    Status On-going      PT LONG TERM GOAL #2   Title Pt will report no LBP occuring across across one month to continue with fitness and ADLs activities    Time 8    Period Weeks    Status On-going      PT LONG TERM GOAL #3   Title Pt will make modifcations to her sitting posture/ work station at work in order to minimize overactivity of pelvic floor    Time 8  Period Weeks    Status Achieved      PT LONG TERM GOAL #4   Title Pt will demo proper deep core coordination with proper diaphragmatic excursion in order to optimize postural stability and pelvic floor function    Time 10    Period Weeks    Status On-going      PT LONG TERM GOAL #5   Title Pt will report no straining with bowel movements 100% of the time and improved boewl frequency from 3-4x/week to daily or every other day across 2 weeks in order to restore pelvic floor function    Time 6    Period Weeks    Status On-going      PT LONG TERM GOAL #6   Title Pt will demo decreased abdominal separation from 3 fingers width above umbilicus to < 1 fingers width and increased lateral diaphragmatic excursion in order to progress to proper pelvic floor lengthening to minimize pelvic pain and restore bowel movements and sexual function without difficulty    Time 10    Period Weeks    Status Achieved      PT LONG TERM GOAL #7   Title Pt will report decreased TMJ pain from 4/10 to < 1/10 and be IND with relaxation practices  in order to increase QOL    Time 8    Period Weeks    Status On-going      PT LONG TERM GOAL #8   Title Pt will demo modifications to fitness routine with IND and proper alignment to strengthen underused mm systems to minimize risk for injuries    Time 10    Period Weeks    Status On-going                   Plan - 11/08/20 1639     Clinical Impression Statement Pt continued to require more manual Tx to minimize interspinal mm tightness at cervicothoracic region on L and R SIJ areas. Pt demo'd increased low abdominal expansion and improved depression of scapula and ribcage with exhalation which will help with minimizing overuse of interspinals / paraspinals and cervical mm and optimize deep core system and pelvic floor lengthening.    Provided cues for more cervical retraction with connection to thoracic extension. Pt demo'd correct form post Tx.  Anticipate more deep cervical endurance strengtehning will help with pt's TMJ/ thoracic complaints. Pt continues to withhold pull ups  which will continue to help minimize overuse of upper trap/ pectand global mm.    Pt continues to benefit from skilled PT and plan to to assess pelvic floor next session. Also continue to address pt on ways to not hyperextend elbows in pushups on knees.     Personal Factors and Comorbidities Fitness;Other    Stability/Clinical Decision Making Evolving/Moderate complexity    Rehab Potential Good    PT Frequency 1x / week    PT Duration Other (comment)   10   PT Treatment/Interventions Balance training;Neuromuscular re-education;Gait training;Moist Heat;Functional mobility training;Therapeutic activities;Patient/family education;Manual techniques;Therapeutic exercise;Taping;Spinal Manipulations;Joint Manipulations;Scar mobilization;Stair training;Traction;Energy conservation    Consulted and Agree with Plan of Care Patient             Patient will benefit from skilled therapeutic intervention in  order to improve the following deficits and impairments:  Increased muscle spasms, Decreased mobility, Decreased coordination, Decreased endurance, Decreased activity tolerance, Decreased range of motion, Decreased strength, Improper body mechanics, Pain, Postural dysfunction  Visit Diagnosis: Sacrococcygeal disorders, not elsewhere classified  Other muscle spasm  Other lack of coordination  TMJ dysfunction  Chronic bilateral low back pain without sciatica  Diastasis recti     Problem List There are no problems to display for this patient.   Mariane Masters ,PT, DPT, E-RYT  11/08/2020, 4:50 PM  [place] [facility] MAIN [facility] SERVICES [address] [place], [place], [address] Phone: [phone]   Fax:  [phone]  Name: Dorothy Owens MRN: [id number] Date of Birth: Mar 01, 1971

## 2020-11-15 ENCOUNTER — Ambulatory Visit: Payer: BC Managed Care – PPO | Admitting: Physical Therapy

## 2020-11-15 ENCOUNTER — Other Ambulatory Visit: Payer: Self-pay

## 2020-11-15 DIAGNOSIS — M6208 Separation of muscle (nontraumatic), other site: Secondary | ICD-10-CM

## 2020-11-15 DIAGNOSIS — M533 Sacrococcygeal disorders, not elsewhere classified: Secondary | ICD-10-CM

## 2020-11-15 DIAGNOSIS — M26609 Unspecified temporomandibular joint disorder, unspecified side: Secondary | ICD-10-CM

## 2020-11-15 DIAGNOSIS — M62838 Other muscle spasm: Secondary | ICD-10-CM

## 2020-11-15 DIAGNOSIS — G8929 Other chronic pain: Secondary | ICD-10-CM

## 2020-11-15 DIAGNOSIS — R278 Other lack of coordination: Secondary | ICD-10-CM
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
## 2020-11-15 NOTE — Patient Instructions (Signed)
See video for cues  Posterior chain exercise in sidelying position   Hatchet throw / reverse pull with straighten leg  Blue band   15 reps

## 2020-11-22 ENCOUNTER — Ambulatory Visit: Payer: BC Managed Care – PPO | Admitting: Physical Therapy

## 2020-11-22 ENCOUNTER — Other Ambulatory Visit: Payer: Self-pay

## 2020-11-22 DIAGNOSIS — M533 Sacrococcygeal disorders, not elsewhere classified: Secondary | ICD-10-CM | POA: Diagnosis not present

## 2020-11-22 DIAGNOSIS — R278 Other lack of coordination: Secondary | ICD-10-CM

## 2020-11-22 DIAGNOSIS — G8929 Other chronic pain: Secondary | ICD-10-CM

## 2020-11-22 DIAGNOSIS — M545 Low back pain, unspecified: Secondary | ICD-10-CM

## 2020-11-22 DIAGNOSIS — M26609 Unspecified temporomandibular joint disorder, unspecified side: Secondary | ICD-10-CM

## 2020-11-22 DIAGNOSIS — M62838 Other muscle spasm: Secondary | ICD-10-CM

## 2020-11-22 DIAGNOSIS — M6208 Separation of muscle (nontraumatic), other site: Secondary | ICD-10-CM

## 2020-11-22 NOTE — Therapy (Signed)
[place] [facility] MAIN [facility] SERVICES [address] [place], [place], [address] Phone: [phone]   Fax:  [phone]  Physical Therapy Treatment  Patient Details  Name: Dorothy Owens MRN: [id number] Date of Birth: [birth year] Referring Provider (PT): Christeen Douglas   Encounter Date: 11/22/2020   PT End of Session - 11/22/20 1417     Visit Number 18    Date for PT Re-Evaluation 01/31/21   PN 11/22/20   PT Start Time 1310    PT Stop Time 1420    PT Time Calculation (min) 70 min    Activity Tolerance Patient tolerated treatment well    Behavior During Therapy [facility] for tasks assessed/performed             No past medical history on file.  No past surgical history on file.  There were no vitals filed for this visit.   Subjective Assessment - 11/22/20 1312     Subjective Pt reports some tightness in B neck and collarbone. The L back rib still pops a little bit. Pt is catching her knee alignment    Pertinent History Hx of twisted ankles R and L,  fall on ice skating in 2013 on R buttock, TMJ, not perinmenopausal yet but periods are shorter    Limitations Lifting    Patient Stated Goals improving skeletal health, bone health and balance                [facility] PT Assessment - 11/22/20 1538       Coordination   Coordination and Movement Description L scapular downward rotation with restiraction from upper neck mm,  cued for scapular retraction/depression caused no clicking in R shoulder when pt pronates arm      Palpation   Palpation comment occiputal mm B tightness, upper cervical/ TMJ attachments tightness B                           [facility] Adult PT Treatment/Exercise - 11/22/20 1539       Neuro Re-ed    Neuro Re-ed Details  cued for cervical propioception , scapular retraction in standing and arm movements to minimize clicking in shoulder      Manual Therapy   Manual therapy comments distraction at occiput B,  STM/MWM at problem areas noted in assesment                         PT Long Term Goals - 11/22/20 1418       PT LONG TERM GOAL #1   Title Pt will demo proper body mechanics to minimize straining of abdomen and pelvic floor with fitness routine (modifications to sit-up/ crunches) ( weight lifting)    Time 4    Period Weeks    Status On-going      PT LONG TERM GOAL #2   Title Pt will report no LBP occuring across across one month to continue with fitness and ADLs activities    Time 8    Period Weeks    Status Achieved      PT LONG TERM GOAL #3   Title Pt will make modifcations to her sitting posture/ work station at work in order to minimize overactivity of pelvic floor    Time 8    Period Weeks    Status Achieved      PT LONG TERM GOAL #4   Title Pt will demo proper deep  core coordination with proper diaphragmatic excursion in order to optimize postural stability and pelvic floor function    Time 10    Period Weeks    Status Achieved      PT LONG TERM GOAL #5   Title Pt will report no straining with bowel movements 100% of the time and improved boewl frequency from 3-4x/week to daily or every other day across 2 weeks in order to restore pelvic floor function    Time 6    Period Weeks    Status Achieved      Additional Long Term Goals   Additional Long Term Goals Yes      PT LONG TERM GOAL #6   Title Pt will demo decreased abdominal separation from 3 fingers width above umbilicus to < 1 fingers width and increased lateral diaphragmatic excursion in order to progress to proper pelvic floor lengthening to minimize pelvic pain and restore bowel movements and sexual function without difficulty    Time 10    Period Weeks    Status Achieved      PT LONG TERM GOAL #7   Title Pt will report decreased TMJ pain from 4/10 to < 1/10 and be IND with relaxation practices in order to increase QOL    Time 8    Period Weeks    Status On-going      PT LONG TERM GOAL #8    Title Pt will demo modifications to fitness routine with IND and proper alignment to strengthen underused mm systems to minimize risk for injuries    Time 10    Period Weeks    Status On-going      PT LONG TERM GOAL  #9   TITLE Pt will demo no tightness of pelvic floor mm across 2 visits in order restore pelvic function    Time 10    Period Weeks    Status New    Target Date 01/31/21                   Plan - 11/22/20 1421     Clinical Impression Statement Pt has achieved 5/9 goals and progressing well towards remaining goals. Pt's hypermobility is better managed with improved postural stability, no more deviations to spine, resolved DRA,  resolved  uneven alignment of pelvis/ SIJ. Pt's LBP has resolved but the areas of shoulder and neck and TMJ remain needing further skilled PT. Manual Tx and neuromuscular reeducation for cervical propioception was the area of focus today. Pt demo'd less lumbar lordosis/ hypermobility at shoulders but still required cues for stabilization techniques. Pt has improved also by modifying her workout routine to not overuse global/ upper trap mm. Pelvic floor tightness will continue to be addressed. Vaginal irritation has resolved with dermatologist 's recommendations.   Pt continues to benefit from skilled PT to achieve remaining goals. .    Personal Factors and Comorbidities Fitness;Other    Stability/Clinical Decision Making Evolving/Moderate complexity    Rehab Potential Good    PT Frequency 1x / week    PT Duration Other (comment)   10   PT Treatment/Interventions Balance training;Neuromuscular re-education;Gait training;Moist Heat;Functional mobility training;Therapeutic activities;Patient/family education;Manual techniques;Therapeutic exercise;Taping;Spinal Manipulations;Joint Manipulations;Scar mobilization;Stair training;Traction;Energy conservation    Consulted and Agree with Plan of Care Patient             Patient will benefit from  skilled therapeutic intervention in order to improve the following deficits and impairments:  Increased muscle spasms, Decreased mobility, Decreased coordination, Decreased  endurance, Decreased activity tolerance, Decreased range of motion, Decreased strength, Improper body mechanics, Pain, Postural dysfunction  Visit Diagnosis: Sacrococcygeal disorders, not elsewhere classified  Other muscle spasm  Other lack of coordination  TMJ dysfunction  Chronic bilateral low back pain without sciatica  Diastasis recti     Problem List There are no problems to display for this patient.   Mariane Masters ,PT, DPT, E-RYT  11/22/2020, 3:45 PM  [place] [facility] MAIN [facility] SERVICES [address] [place], [place], [address] Phone: [phone]   Fax:  [phone]  Name: Daleyza Gadomski MRN: [id number] Date of Birth: 1971-01-12

## 2020-12-02 ENCOUNTER — Other Ambulatory Visit: Payer: Self-pay

## 2020-12-02 ENCOUNTER — Ambulatory Visit
Admission: RE | Admit: 2020-12-02 | Discharge: 2020-12-02 | Disposition: A | Discharge status: Home / Self Care | Payer: BC Managed Care – PPO | Source: Ambulatory Visit | Attending: Obstetrics and Gynecology | Admitting: Obstetrics and Gynecology

## 2020-12-02 DIAGNOSIS — Z1231 Encounter for screening mammogram for malignant neoplasm of breast: Secondary | ICD-10-CM | POA: Insufficient documentation

## 2020-12-15 ENCOUNTER — Other Ambulatory Visit: Payer: Self-pay

## 2020-12-15 ENCOUNTER — Ambulatory Visit: Payer: BC Managed Care – PPO | Attending: Obstetrics and Gynecology | Admitting: Physical Therapy

## 2020-12-15 DIAGNOSIS — M545 Low back pain, unspecified: Secondary | ICD-10-CM | POA: Diagnosis present

## 2020-12-15 DIAGNOSIS — M62838 Other muscle spasm: Secondary | ICD-10-CM | POA: Insufficient documentation

## 2020-12-15 DIAGNOSIS — M26609 Unspecified temporomandibular joint disorder, unspecified side: Secondary | ICD-10-CM | POA: Diagnosis present

## 2020-12-15 DIAGNOSIS — G8929 Other chronic pain: Secondary | ICD-10-CM | POA: Diagnosis present

## 2020-12-15 DIAGNOSIS — M533 Sacrococcygeal disorders, not elsewhere classified: Secondary | ICD-10-CM | POA: Diagnosis present

## 2020-12-15 DIAGNOSIS — M6208 Separation of muscle (nontraumatic), other site: Secondary | ICD-10-CM | POA: Insufficient documentation

## 2020-12-15 DIAGNOSIS — R278 Other lack of coordination: Secondary | ICD-10-CM | POA: Insufficient documentation

## 2020-12-15 NOTE — Patient Instructions (Signed)
Relaxation of pelvic floor mm  Understand stability principles with fitness activties

## 2020-12-15 NOTE — Therapy (Signed)
[facility] [facility] MAIN [facility] SERVICES [address] [place], [place], [address] Phone: [phone]   Fax:  [phone]  Physical Therapy Treatment  Patient Details  Name: Dorothy Owens MRN: [id number] Date of Birth: 04/23/1971 Referring Provider (PT): Christeen Douglas   Encounter Date: 12/15/2020   PT End of Session - 12/15/20 1311     Visit Number 19    Date for PT Re-Evaluation 01/31/21   PN 11/22/20   PT Start Time 1305    PT Stop Time 1400    PT Time Calculation (min) 55 min    Activity Tolerance Patient tolerated treatment well    Behavior During Therapy [facility] for tasks assessed/performed             No past medical history on file.  No past surgical history on file.  There were no vitals filed for this visit.   Subjective Assessment - 12/15/20 1306     Subjective Pt reported she hurt R shoulder this past Sunday when she did push ups with one hand alternating on a ball. When she had her R hand on the ball, she felt her shoulder hurt after that. Pt tried to do pull ups that same night.  She was able to do one and it was tough. Pt's neck and clavicle feels sore.    Pertinent History Hx of twisted ankles R and L,  fall on ice skating in 2013 on R buttock, TMJ, not perinmenopausal yet but periods are shorter    Limitations Lifting    Patient Stated Goals improving skeletal health, bone health and balance                [facility] PT Assessment - 12/15/20 1429       Observation/Other Assessments   Observations no forward head posture, COM is more anterior      Palpation   SI assessment  levelled iliac crest/ shoulders.    Palpation comment increased tightnes at posterior upper UE quadrant and                        Pelvic Floor Special Questions - 12/15/20 1431     External Perineal Exam tightness at B obt int, perineum, bulbospongious distal    Skin Integrity --   no redness at perineum              [facility]  Adult PT Treatment/Exercise - 12/15/20 1432       Therapeutic Activites    Other Therapeutic Activities discussed progress and principles of whya wide push up with ball and trying pull up could have caused shoulder tightness      Neuro Re-ed    Neuro Re-ed Details  cued for lengthening of pelvic floor and less upper ab overuse      Manual Therapy   Manual therapy comments STM/MW  at problem areas noted in assesment                         PT Long Term Goals - 11/22/20 1418       PT LONG TERM GOAL #1   Title Pt will demo proper body mechanics to minimize straining of abdomen and pelvic floor with fitness routine (modifications to sit-up/ crunches) ( weight lifting)    Time 4    Period Weeks    Status On-going      PT LONG TERM GOAL #2   Title Pt will  report no LBP occuring across across one month to continue with fitness and ADLs activities    Time 8    Period Weeks    Status Achieved      PT LONG TERM GOAL #3   Title Pt will make modifcations to her sitting posture/ work station at work in order to minimize overactivity of pelvic floor    Time 8    Period Weeks    Status Achieved      PT LONG TERM GOAL #4   Title Pt will demo proper deep core coordination with proper diaphragmatic excursion in order to optimize postural stability and pelvic floor function    Time 10    Period Weeks    Status Achieved      PT LONG TERM GOAL #5   Title Pt will report no straining with bowel movements 100% of the time and improved boewl frequency from 3-4x/week to daily or every other day across 2 weeks in order to restore pelvic floor function    Time 6    Period Weeks    Status Achieved      Additional Long Term Goals   Additional Long Term Goals Yes      PT LONG TERM GOAL #6   Title Pt will demo decreased abdominal separation from 3 fingers width above umbilicus to < 1 fingers width and increased lateral diaphragmatic excursion in order to progress to proper pelvic  floor lengthening to minimize pelvic pain and restore bowel movements and sexual function without difficulty    Time 10    Period Weeks    Status Achieved      PT LONG TERM GOAL #7   Title Pt will report decreased TMJ pain from 4/10 to < 1/10 and be IND with relaxation practices in order to increase QOL    Time 8    Period Weeks    Status On-going      PT LONG TERM GOAL #8   Title Pt will demo modifications to fitness routine with IND and proper alignment to strengthen underused mm systems to minimize risk for injuries    Time 10    Period Weeks    Status On-going      PT LONG TERM GOAL  #9   TITLE Pt will demo no tightness of pelvic floor mm across 2 visits in order restore pelvic function    Time 10    Period Weeks    Status New    Target Date 01/31/21                   Plan - 12/15/20 1434     Clinical Impression Statement   Posture showed improvement after missing PT for 2 weeks. Pt reported hurting her R shoulder with fitness exercises.  Pt shoulder showed no significant relapse and encouraged pt on her progress while educating her  why a wide push up with ball and trying pull up could have caused shoulder tightness and timing is too soon for these challenging exercises.   Pt showed increased tightness at pelvic floor at perineum and obturator internus B which decreased following external techniques. Skin at perineum was no longer red.  Plan to continue training relaxation of pelvic floor and perform internal assessment next session. Pt continues to benefit from skilled PT    Personal Factors and Comorbidities Fitness;Other    Stability/Clinical Decision Making Evolving/Moderate complexity    Rehab Potential Good    PT Frequency 1x / week  PT Duration Other (comment)   10   PT Treatment/Interventions Balance training;Neuromuscular re-education;Gait training;Moist Heat;Functional mobility training;Therapeutic activities;Patient/family education;Manual  techniques;Therapeutic exercise;Taping;Spinal Manipulations;Joint Manipulations;Scar mobilization;Stair training;Traction;Energy conservation    Consulted and Agree with Plan of Care Patient             Patient will benefit from skilled therapeutic intervention in order to improve the following deficits and impairments:  Increased muscle spasms, Decreased mobility, Decreased coordination, Decreased endurance, Decreased activity tolerance, Decreased range of motion, Decreased strength, Improper body mechanics, Pain, Postural dysfunction  Visit Diagnosis: Sacrococcygeal disorders, not elsewhere classified  Other muscle spasm  Other lack of coordination  TMJ dysfunction  Chronic bilateral low back pain without sciatica     Problem List There are no problems to display for this patient.   Mariane Masters ,PT, DPT, E-RYT   12/15/2020, 2:35 PM  [place] [facility] MAIN [facility] SERVICES [address] [place], [place], [address] Phone: [phone]   Fax:  [phone]  Name: Joanny Dupree MRN: [id number] Date of Birth: Jun 21, 1970

## 2020-12-22 ENCOUNTER — Other Ambulatory Visit: Payer: Self-pay | Admitting: Obstetrics and Gynecology

## 2020-12-22 ENCOUNTER — Other Ambulatory Visit: Payer: Self-pay

## 2020-12-22 ENCOUNTER — Ambulatory Visit: Payer: BC Managed Care – PPO | Admitting: Physical Therapy

## 2020-12-22 DIAGNOSIS — R921 Mammographic calcification found on diagnostic imaging of breast: Secondary | ICD-10-CM

## 2020-12-22 DIAGNOSIS — M62838 Other muscle spasm: Secondary | ICD-10-CM

## 2020-12-22 DIAGNOSIS — M533 Sacrococcygeal disorders, not elsewhere classified: Secondary | ICD-10-CM

## 2020-12-22 DIAGNOSIS — M26609 Unspecified temporomandibular joint disorder, unspecified side: Secondary | ICD-10-CM

## 2020-12-22 DIAGNOSIS — G8929 Other chronic pain: Secondary | ICD-10-CM

## 2020-12-22 DIAGNOSIS — M6208 Separation of muscle (nontraumatic), other site: Secondary | ICD-10-CM

## 2020-12-22 DIAGNOSIS — R278 Other lack of coordination: Secondary | ICD-10-CM

## 2020-12-22 NOTE — Patient Instructions (Addendum)
   Feet slides :   Points of contact at sitting bones  Four points of contact of foot, Heel up, ankle not twist out Lower heel Four points of contact of foot, Slide foot back   Repeated with other foot    __  Feet care :  Self -feet massage   Handshake : fingers between toes, moving ballmounds/toes back and forth several times while other hand anchors at arch. Do the same at the hind/mid foot.  Heel to toes upward to a letter Big Letter T strokes to spread ballmounds and toes, several times, pinch between webs of toes  Run finger tips along top of foot between long bones "comb between the bones"    Wiggle toes and spread them out when relaxing    ___  Minisquat: Scoot buttocks back slight, hinge like you are looking at your reflection on a pond  Knees behind toes,  Inhale to "smell flowers"  Exhale on the rise "like rocket"  Do not lock knees, have more weight across ballmounds of feet, toes relaxed   10 reps x 3 x day  ____   Stabilize shoulder blade down and back and COM over feet when stepping during partner swing dancing

## 2020-12-23 NOTE — Therapy (Signed)
[facility] MAIN [facility] SERVICES [address] [place], [place], [address] Phone: [phone]   Fax:  [phone]  Physical Therapy Treatment ./ Progress Note  Patient Details  Name: Dorothy Owens MRN: [id number] Date of Birth: 1970-09-15 Referring Provider (PT): Christeen Douglas   Encounter Date: 12/22/2020   PT End of Session - 12/22/20 1408     Visit Number 20    Date for PT Re-Evaluation 01/31/21   PN 11/22/20   PT Start Time 1304    PT Stop Time 1408    PT Time Calculation (min) 64 min    Activity Tolerance Patient tolerated treatment well    Behavior During Therapy [facility] for tasks assessed/performed             No past medical history on file.  No past surgical history on file.  There were no vitals filed for this visit.   Subjective Assessment - 12/23/20 0957     Subjective Pt reported she caught herself tightening her abs and tried to relax her pelvic floor. Pt noticed tweak in her L knee when doing a squat and she notices her knees rolls in    Pertinent History Hx of twisted ankles R and L,  fall on ice skating in 2013 on R buttock, TMJ, not perinmenopausal yet but periods are shorter    Limitations Lifting    Patient Stated Goals improving skeletal health, bone health and balance                [facility] PT Assessment - 12/23/20 0958       Lunges   Comments front foot, supination, unstable ankle, poor knee alignment      Other:   Other/Comments simulated swing dance, R arm not stabilized scapular stabilization, COM not in alignment with ballmounds when stepping forward      Posture/Postural Control   Posture Comments hyperextended knees, pronation      Palpation   Palpation comment tightness at L peroneal longus/ brevis, tightness between digit II-II, lateral intrinsic feet mm                           [facility] Adult PT Treatment/Exercise - 12/23/20 0958       Therapeutic Activites    Other  Therapeutic Activities cued for scapular stabilization and more anterior COM over feet in swing dancing with partner to minimzie instability of shoulder      Neuro Re-ed    Neuro Re-ed Details  cued for more toe abduction, more eeversion, less supination      Manual Therapy   Manual therapy comments STM/MW  at problem areas noted in assesment to promote DF/EV, ER of femur                         PT Long Term Goals - 12/23/20 0954       PT LONG TERM GOAL #1   Title Pt will demo proper body mechanics to minimize straining of abdomen and pelvic floor with fitness routine (modifications to sit-up/ crunches) ( weight lifting)    Time 4    Period Weeks    Status On-going      PT LONG TERM GOAL #2   Title Pt will report no LBP occuring across across one month to continue with fitness and ADLs activities    Time 8    Period Weeks    Status Achieved  PT LONG TERM GOAL #3   Title Pt will make modifcations to her sitting posture/ work station at work in order to minimize overactivity of pelvic floor    Time 8    Period Weeks    Status Achieved      PT LONG TERM GOAL #4   Title Pt will demo proper deep core coordination with proper diaphragmatic excursion in order to optimize postural stability and pelvic floor function    Time 10    Period Weeks    Status Achieved      PT LONG TERM GOAL #5   Title Pt will report no straining with bowel movements 100% of the time and improved boewl frequency from 3-4x/week to daily or every other day across 2 weeks in order to restore pelvic floor function    Time 6    Period Weeks    Status Achieved      PT LONG TERM GOAL #6   Title Pt will demo decreased abdominal separation from 3 fingers width above umbilicus to < 1 fingers width and increased lateral diaphragmatic excursion in order to progress to proper pelvic floor lengthening to minimize pelvic pain and restore bowel movements and sexual function without difficulty    Time  10    Period Weeks    Status Achieved      PT LONG TERM GOAL #7   Title Pt will report decreased TMJ pain from 4/10 to < 1/10 and be IND with relaxation practices in order to increase QOL    Time 8    Period Weeks    Status On-going      PT LONG TERM GOAL #8   Title Pt will demo modifications to fitness routine with IND and proper alignment to strengthen underused mm systems to minimize risk for injuries    Time 10    Period Weeks    Status On-going      PT LONG TERM GOAL  #9   TITLE Pt will demo no tightness of pelvic floor mm across 2 visits in order restore pelvic function    Time 10    Period Weeks    Status On-going                   Plan - 12/23/20 1018     Clinical Impression Statement Pt has achieved 5/ 9 goals and is progressing well with remaining goals. Pt has demonstrated resolved diastasis, optimal deep core system, deep back mm strength, no more forward head posture and rotational components to her thoracic/ lumbar spine.  Pt demonstrates improved trunk stability and requires no more in decreased lumbar lordosis. Pt demonstrates more IND with proper body mechanics/ modifications  in her fitness routine but willl continue to require more manual Tx and neuromuscular re-education for more training. Today, pt demo'd instability of knee and ankle in lunges ( L foot more unstable than R) and this is associated with tightness along lateral leg and foot of LLE. This side of LE has also been her stabilizing leg with swing dancing. Pt also continues to require cues for scapular stabilization when simulating sing dancing which is a hobby that she will be returning to. Pt continues to benefit from skilled PT to achieve remaining goals.    Personal Factors and Comorbidities Fitness;Other    Stability/Clinical Decision Making Evolving/Moderate complexity    Rehab Potential Good    PT Frequency 1x / week    PT Duration Other (comment)   10  PT Treatment/Interventions Balance  training;Neuromuscular re-education;Gait training;Moist Heat;Functional mobility training;Therapeutic activities;Patient/family education;Manual techniques;Therapeutic exercise;Taping;Spinal Manipulations;Joint Manipulations;Scar mobilization;Stair training;Traction;Energy conservation    Consulted and Agree with Plan of Care Patient             Patient will benefit from skilled therapeutic intervention in order to improve the following deficits and impairments:  Increased muscle spasms, Decreased mobility, Decreased coordination, Decreased endurance, Decreased activity tolerance, Decreased range of motion, Decreased strength, Improper body mechanics, Pain, Postural dysfunction  Visit Diagnosis: Sacrococcygeal disorders, not elsewhere classified  Other muscle spasm  Other lack of coordination  Chronic bilateral low back pain without sciatica  TMJ dysfunction  Diastasis recti     Problem List There are no problems to display for this patient.   Mariane Masters ,PT, DPT, E-RYT  12/23/2020, 10:21 AM  [place] [facility] MAIN [facility] SERVICES [address] [place], [place], [address] Phone: [phone]   Fax:  [phone]  Name: Dorothy Owens MRN: [id number] Date of Birth: 01/14/1971

## 2020-12-29 ENCOUNTER — Other Ambulatory Visit: Payer: Self-pay

## 2020-12-29 ENCOUNTER — Ambulatory Visit: Payer: BC Managed Care – PPO | Admitting: Physical Therapy

## 2020-12-29 DIAGNOSIS — M545 Low back pain, unspecified: Secondary | ICD-10-CM

## 2020-12-29 DIAGNOSIS — M62838 Other muscle spasm: Secondary | ICD-10-CM

## 2020-12-29 DIAGNOSIS — M6208 Separation of muscle (nontraumatic), other site: Secondary | ICD-10-CM

## 2020-12-29 DIAGNOSIS — M533 Sacrococcygeal disorders, not elsewhere classified: Secondary | ICD-10-CM

## 2020-12-29 DIAGNOSIS — R278 Other lack of coordination: Secondary | ICD-10-CM

## 2020-12-29 DIAGNOSIS — M26609 Unspecified temporomandibular joint disorder, unspecified side: Secondary | ICD-10-CM

## 2020-12-29 DIAGNOSIS — G8929 Other chronic pain: Secondary | ICD-10-CM

## 2020-12-29 NOTE — Therapy (Addendum)
[facility] MAIN [facility] SERVICES [address] [place], [place], [address] Phone: [phone]   Fax:  [phone]  Physical Therapy Treatment  Patient Details  Name: Dorothy Owens MRN: [id number] Date of Birth: 1970/12/16 Referring Provider (PT): Christeen Douglas   Encounter Date: 12/29/2020   PT End of Session - 12/30/20 1143   Visit  22  Date for PT Re-Evaluation 01/31/21   PN 11/22/20   PT Start Time 1305    PT Stop Time 1400    PT Time Calculation (min) 55 min    Activity Tolerance Patient tolerated treatment well    Behavior During Therapy [facility] for tasks assessed/performed             No past medical history on file.  No past surgical history on file.  There were no vitals filed for this visit.   Subjective Assessment - 12/30/20 1142     Subjective Pt reported noticing L top of foot feels strained which has happened in the past. Pt is not sure of a pattern to cause it. L back rib still pops sometimes.    Pertinent History Hx of twisted ankles R and L,  fall on ice skating in 2013 on R buttock, TMJ, not perinmenopausal yet but periods are shorter    Limitations Lifting    Patient Stated Goals improving skeletal health, bone health and balance                [facility] PT Assessment - 12/30/20 1138       Other:   Other/ Comments narrow hand placement in push up on floor, progression of dolphin plank with feet on chair, 5 reps before fatigue, no lumbar lordosis, simulated swing dance steps: noted breathholding, poor scapular stabilization , good carry over with COM over feet,                           [facility] Adult PT Treatment/Exercise - 12/30/20 1140       Therapeutic Activites    Other Therapeutic Activities applied stabilization principles in swing dancing and fitness program      Neuro Re-ed    Neuro Re-ed Details  cued for upper kinetic chain in [facility] for push ups on ground and wall , stabilization  principles and techniques to address : porgression of dolphin plank with feet on chair, 5 reps before fatigue, no lumbar lordosis, simulated swing dance steps: noted breathholding, poor scapular stabilization , good carry over with COM over feet,                         PT Long Term Goals - 12/23/20 0954       PT LONG TERM GOAL #1   Title Pt will demo proper body mechanics to minimize straining of abdomen and pelvic floor with fitness routine (modifications to sit-up/ crunches) ( weight lifting)    Time 4    Period Weeks    Status On-going      PT LONG TERM GOAL #2   Title Pt will report no LBP occuring across across one month to continue with fitness and ADLs activities    Time 8    Period Weeks    Status Achieved      PT LONG TERM GOAL #3   Title Pt will make modifcations to her sitting posture/ work station at work in order to minimize overactivity of pelvic floor  Time 8    Period Weeks    Status Achieved      PT LONG TERM GOAL #4   Title Pt will demo proper deep core coordination with proper diaphragmatic excursion in order to optimize postural stability and pelvic floor function    Time 10    Period Weeks    Status Achieved      PT LONG TERM GOAL #5   Title Pt will report no straining with bowel movements 100% of the time and improved boewl frequency from 3-4x/week to daily or every other day across 2 weeks in order to restore pelvic floor function    Time 6    Period Weeks    Status Achieved      PT LONG TERM GOAL #6   Title Pt will demo decreased abdominal separation from 3 fingers width above umbilicus to < 1 fingers width and increased lateral diaphragmatic excursion in order to progress to proper pelvic floor lengthening to minimize pelvic pain and restore bowel movements and sexual function without difficulty    Time 10    Period Weeks    Status Achieved      PT LONG TERM GOAL #7   Title Pt will report decreased TMJ pain from 4/10 to < 1/10 and  be IND with relaxation practices in order to increase QOL    Time 8    Period Weeks    Status On-going      PT LONG TERM GOAL #8   Title Pt will demo modifications to fitness routine with IND and proper alignment to strengthen underused mm systems to minimize risk for injuries    Time 10    Period Weeks    Status On-going      PT LONG TERM GOAL  #9   TITLE Pt will demo no tightness of pelvic floor mm across 2 visits in order restore pelvic function    Time 10    Period Weeks    Status On-going                   Plan - 12/29/20 1413     Clinical Impression Statement Pt benefitted from skilled PT with cues for stabilization of joints and progressions with against gravity fitness exercises. Pt showed less lumbar lordosis which demo'd improved postural stability.   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 Complimentary exercises were [providede to minimzie the torsion and R shoulder protraction that comes with this style of dancing.  Pt continues to benefits from skilled PT.    Personal Factors and Comorbidities Fitness;Other    Stability/Clinical Decision Making Evolving/Moderate complexity    Rehab Potential Good    PT Frequency 1x / week    PT Duration Other (comment)   10   PT Treatment/Interventions Balance training;Neuromuscular re-education;Gait training;Moist Heat;Functional mobility training;Therapeutic activities;Patient/family education;Manual techniques;Therapeutic exercise;Taping;Spinal Manipulations;Joint Manipulations;Scar mobilization;Stair training;Traction;Energy conservation    Consulted and Agree with Plan of Care Patient             Patient will benefit from skilled therapeutic intervention in order to improve the following deficits and impairments:  Increased muscle spasms, Decreased mobility, Decreased coordination, Decreased endurance, Decreased activity tolerance,  Decreased range of motion, Decreased strength, Improper body mechanics, Pain, Postural dysfunction  Visit Diagnosis: Sacrococcygeal disorders, not elsewhere classified  Other muscle spasm  Other lack of coordination  Chronic bilateral low back pain without sciatica  TMJ dysfunction  Diastasis recti     Problem List There are no problems to display for this patient.   Mariane Masters ,PT, DPT, E-RYT  12/30/2020, 11:43 AM  [place] [facility] MAIN [facility] SERVICES [address] [place], [place], [address] Phone: [phone]   Fax:  [phone]  Name: Mailee Klaas MRN: [id number] Date of Birth: [birth year]

## 2020-12-29 NOTE — Patient Instructions (Signed)
Dolphin plank, ( fists) shoulders down, chin tuck Foot on low stool  Back and forth small distance - not past thumb at brow     Side plank way of lowerin with mini squat first, to minimize shoulder injuries   Wider hand placemetn for collar bones  In kneeling dog and wall push ups with knuckles bent     Dog- no locking elbows,  __  To counteract dancing overuse of R side and torsion:  Double kneeling with band on cabinet: half w: pull down 5 more reps on R   Band on doorway (PNF) to rotate back R   More reps of rotator cuff on R    Ground, navel stacked, shoulder squeezing back and breathe wide  As you ground    __  Catch yourself no leaning on one leg   Ski track stance sometimes

## 2021-01-05 ENCOUNTER — Ambulatory Visit: Payer: BC Managed Care – PPO | Admitting: Physical Therapy

## 2021-01-12 ENCOUNTER — Ambulatory Visit: Payer: BC Managed Care – PPO | Attending: Obstetrics and Gynecology | Admitting: Physical Therapy

## 2021-01-12 ENCOUNTER — Other Ambulatory Visit: Payer: Self-pay

## 2021-01-12 DIAGNOSIS — M533 Sacrococcygeal disorders, not elsewhere classified: Secondary | ICD-10-CM | POA: Insufficient documentation

## 2021-01-12 DIAGNOSIS — M62838 Other muscle spasm: Secondary | ICD-10-CM | POA: Diagnosis not present

## 2021-01-12 DIAGNOSIS — M545 Low back pain, unspecified: Secondary | ICD-10-CM | POA: Diagnosis present

## 2021-01-12 DIAGNOSIS — R278 Other lack of coordination: Secondary | ICD-10-CM | POA: Diagnosis present

## 2021-01-12 DIAGNOSIS — G8929 Other chronic pain: Secondary | ICD-10-CM | POA: Diagnosis present

## 2021-01-12 DIAGNOSIS — M6208 Separation of muscle (nontraumatic), other site: Secondary | ICD-10-CM

## 2021-01-12 DIAGNOSIS — M26609 Unspecified temporomandibular joint disorder, unspecified side: Secondary | ICD-10-CM | POA: Diagnosis present

## 2021-01-13 NOTE — Therapy (Signed)
[facility] MAIN [facility] SERVICES [address] [place], [place], [address] Phone: [phone]   Fax:  [phone]  Physical Therapy Treatment  Patient Details  Name: Dorothy Owens MRN: [id number] Date of Birth: [birth year] Referring Provider (PT): Christeen Douglas   Encounter Date: 01/12/2021   PT End of Session - 01/12/21 1306     Visit Number 22    Date for PT Re-Evaluation 01/31/21   PN 11/22/20   PT Start Time 1303    PT Stop Time 1400    PT Time Calculation (min) 57 min    Activity Tolerance Patient tolerated treatment well    Behavior During Therapy [facility] for tasks assessed/performed             No past medical history on file.  No past surgical history on file.  There were no vitals filed for this visit.   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    Pertinent History Hx of twisted ankles R and L,  fall on ice skating in 2013 on R buttock, TMJ, not perinmenopausal yet but periods are shorter    Limitations Lifting    Patient Stated Goals improving skeletal health, bone health and balance                               [facility] Adult PT Treatment/Exercise - 01/13/21 1336       Therapeutic Activites    Other Therapeutic Activities active listening, biopsychosocial approaches to relaxation of breath, lowering of ribcage      Neuro Re-ed    Neuro Re-ed Details  cued for figure 8 armswings to involve torso bilateral  rotation      Manual Therapy   Manual therapy comments STM/MWM at intercostal T10-12 anterior SCJ L                          PT Long Term Goals - 01/12/21 1309       PT LONG TERM GOAL #1   Title Pt will demo proper body mechanics to minimize straining of abdomen and pelvic floor with fitness routine (modifications to sit-up/ crunches) (  weight lifting)    Time 4    Period Weeks    Status On-going      PT LONG TERM GOAL #2   Title Pt will report no LBP occuring across across one month to continue with fitness and ADLs activities    Time 8    Period Weeks    Status Achieved      PT LONG TERM GOAL #3   Title Pt will make modifcations to her sitting posture/ work station at work in order to minimize overactivity of pelvic floor    Time 8    Period Weeks    Status Achieved      PT LONG TERM GOAL #4   Title Pt will demo proper deep core coordination with proper diaphragmatic excursion in order to optimize postural stability and pelvic floor function    Time 10    Period Weeks    Status Achieved      PT LONG TERM GOAL #5   Title Pt will report no straining with bowel movements 100% of the time and improved boewl frequency from 3-4x/week  to daily or every other day across 2 weeks in order to restore pelvic floor function    Time 6    Period Weeks    Status Achieved      PT LONG TERM GOAL #6   Title Pt will demo decreased abdominal separation from 3 fingers width above umbilicus to < 1 fingers width and increased lateral diaphragmatic excursion in order to progress to proper pelvic floor lengthening to minimize pelvic pain and restore bowel movements and sexual function without difficulty    Time 10    Period Weeks    Status Achieved      PT LONG TERM GOAL #7   Title Pt will report decreased TMJ pain from 4/10 to < 1/10 and be IND with relaxation practices in order to increase QOL    Time 8    Period Weeks    Status On-going      PT LONG TERM GOAL #8   Title Pt will demo modifications to fitness routine with IND and proper alignment to strengthen underused mm systems to minimize risk for injuries    Time 10    Period Weeks    Status On-going      PT LONG TERM GOAL  #9   TITLE Pt will demo no tightness of pelvic floor mm across 2 visits in order restore pelvic function    Time 10    Period Weeks    Status  On-going                   Plan - 01/12/21 1307     Clinical Impression Statement Pt    Personal Factors and Comorbidities Fitness;Other    Stability/Clinical Decision Making Evolving/Moderate complexity    Rehab Potential Good    PT Frequency 1x / week    PT Duration Other (comment)   10   PT Treatment/Interventions Balance training;Neuromuscular re-education;Gait training;Moist Heat;Functional mobility training;Therapeutic activities;Patient/family education;Manual techniques;Therapeutic exercise;Taping;Spinal Manipulations;Joint Manipulations;Scar mobilization;Stair training;Traction;Energy conservation    Consulted and Agree with Plan of Care Patient             Patient will benefit from skilled therapeutic intervention in order to improve the following deficits and impairments:  Increased muscle spasms, Decreased mobility, Decreased coordination, Decreased endurance, Decreased activity tolerance, Decreased range of motion, Decreased strength, Improper body mechanics, Pain, Postural dysfunction  Visit Diagnosis: Other muscle spasm  Sacrococcygeal disorders, not elsewhere classified  Other lack of coordination  Chronic bilateral low back pain without sciatica  TMJ dysfunction  Diastasis recti     Problem List There are no problems to display for this patient.   Mariane Masters, PT 01/13/2021, 1:40 PM  [facility] MAIN [facility] SERVICES [address] [place], [place], [address] Phone: [phone]   Fax:  [phone]  Name: Dorothy Owens MRN: [id number] Date of Birth: Oct 14, 1970

## 2021-01-13 NOTE — Patient Instructions (Signed)
Figure 8 arm swings to promote thoracic rotation  Relaxation of thorax with exhalation

## 2021-01-13 NOTE — Therapy (Signed)
[facility] MAIN [facility] SERVICES [address] [place], [place], [address] Phone: [phone]   Fax:  [phone]  Physical Therapy Treatment  Patient Details  Name: Dorothy Owens MRN: [id number] Date of Birth: 08-26-1970 Referring Provider (PT): Christeen Douglas   Encounter Date: 01/12/2021   PT End of Session - 01/12/21 1306     Visit Number 22    Date for PT Re-Evaluation 01/31/21   PN 11/22/20   PT Start Time 1303    PT Stop Time 1400    PT Time Calculation (min) 57 min    Activity Tolerance Patient tolerated treatment well    Behavior During Therapy [facility] for tasks assessed/performed             No past medical history on file.  No past surgical history on file.  There were no vitals filed for this visit.   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    Pertinent History Hx of twisted ankles R and L,  fall on ice skating in 2013 on R buttock, TMJ, not perinmenopausal yet but periods are shorter    Limitations Lifting    Patient Stated Goals improving skeletal health, bone health and balance                [facility] PT Assessment - 01/13/21 1344       Posture/Postural Control   Posture Comments rib elevation, breathholding      Palpation   Spinal mobility intercostal T10-12 anterior SCJ L                           [facility] Adult PT Treatment/Exercise - 01/13/21 1336       Therapeutic Activites    Other Therapeutic Activities active listening, biopsychosocial approaches to relaxation of breath, lowering of ribcage      Neuro Re-ed    Neuro Re-ed Details  cued for figure 8 armswings to involve torso bilateral  rotation      Manual Therapy   Manual therapy comments STM/MWM at intercostal T10-12 anterior SCJ L                          PT Long Term  Goals - 01/12/21 1309       PT LONG TERM GOAL #1   Title Pt will demo proper body mechanics to minimize straining of abdomen and pelvic floor with fitness routine (modifications to sit-up/ crunches) ( weight lifting)    Time 4    Period Weeks    Status On-going      PT LONG TERM GOAL #2   Title Pt will report no LBP occuring across across one month to continue with fitness and ADLs activities    Time 8    Period Weeks    Status Achieved      PT LONG TERM GOAL #3   Title Pt will make modifcations to her sitting posture/ work station at work in order to minimize overactivity of pelvic floor    Time 8    Period Weeks    Status Achieved      PT LONG TERM GOAL #4   Title Pt will demo proper deep core coordination with proper diaphragmatic excursion in order to optimize postural stability and pelvic floor  function    Time 10    Period Weeks    Status Achieved      PT LONG TERM GOAL #5   Title Pt will report no straining with bowel movements 100% of the time and improved boewl frequency from 3-4x/week to daily or every other day across 2 weeks in order to restore pelvic floor function    Time 6    Period Weeks    Status Achieved      PT LONG TERM GOAL #6   Title Pt will demo decreased abdominal separation from 3 fingers width above umbilicus to < 1 fingers width and increased lateral diaphragmatic excursion in order to progress to proper pelvic floor lengthening to minimize pelvic pain and restore bowel movements and sexual function without difficulty    Time 10    Period Weeks    Status Achieved      PT LONG TERM GOAL #7   Title Pt will report decreased TMJ pain from 4/10 to < 1/10 and be IND with relaxation practices in order to increase QOL    Time 8    Period Weeks    Status On-going      PT LONG TERM GOAL #8   Title Pt will demo modifications to fitness routine with IND and proper alignment to strengthen underused mm systems to minimize risk for injuries    Time 10     Period Weeks    Status On-going      PT LONG TERM GOAL  #9   TITLE Pt will demo no tightness of pelvic floor mm across 2 visits in order restore pelvic function    Time 10    Period Weeks    Status On-going                   Plan - 01/12/21 1307     Clinical Impression Statement Pt demonstrated remaining tightness of anterior and posterior intercostal space T10-12 on L and required manual Tx to rotate posterior L direction. 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 Pt demo'd improved excursion of diaphragm post Tx and able to lower lower ribs on exhalation. Pt continues to benefit from skilled PT.    Personal Factors and Comorbidities Fitness;Other    Stability/Clinical Decision Making Evolving/Moderate complexity    Rehab Potential Good    PT Frequency 1x / week    PT Duration Other (comment)   10   PT Treatment/Interventions Balance training;Neuromuscular re-education;Gait training;Moist Heat;Functional mobility training;Therapeutic activities;Patient/family education;Manual techniques;Therapeutic exercise;Taping;Spinal Manipulations;Joint Manipulations;Scar mobilization;Stair training;Traction;Energy conservation    Consulted and Agree with Plan of Care Patient             Patient will benefit from skilled therapeutic intervention in order to improve the following deficits and impairments:  Increased muscle spasms, Decreased mobility, Decreased coordination, Decreased endurance, Decreased activity tolerance, Decreased range of motion, Decreased strength, Improper body mechanics, Pain, Postural dysfunction  Visit Diagnosis: Other muscle spasm  Sacrococcygeal disorders, not elsewhere classified  Other lack of coordination  Chronic bilateral low back pain without sciatica  TMJ dysfunction  Diastasis recti     Problem List There are no problems to display for this  patient.   Mariane Masters, PT 01/13/2021, 1:45 PM   [facility] MAIN [facility] SERVICES [address] [place], [place], [address] Phone: [phone]   Fax:  [phone]  Name: Kadyn Chovan MRN:  [id number] Date of Birth: 04-05-1971

## 2021-01-19 ENCOUNTER — Other Ambulatory Visit: Payer: Self-pay

## 2021-01-19 ENCOUNTER — Ambulatory Visit: Payer: BC Managed Care – PPO | Admitting: Physical Therapy

## 2021-01-19 DIAGNOSIS — G8929 Other chronic pain: Secondary | ICD-10-CM

## 2021-01-19 DIAGNOSIS — M26609 Unspecified temporomandibular joint disorder, unspecified side: Secondary | ICD-10-CM

## 2021-01-19 DIAGNOSIS — R278 Other lack of coordination: Secondary | ICD-10-CM

## 2021-01-19 DIAGNOSIS — M6208 Separation of muscle (nontraumatic), other site: Secondary | ICD-10-CM

## 2021-01-19 DIAGNOSIS — M533 Sacrococcygeal disorders, not elsewhere classified: Secondary | ICD-10-CM

## 2021-01-19 DIAGNOSIS — M62838 Other muscle spasm: Secondary | ICD-10-CM

## 2021-01-19 NOTE — Therapy (Signed)
[facility] MAIN [facility] SERVICES [address] [place], [place], [address] Phone: [phone]   Fax:  [phone]  Physical Therapy Treatment  Patient Details  Name: Dorothy Owens MRN: [id number] Date of Birth: 1970-10-15 Referring Provider (PT): Christeen Douglas   Encounter Date: 01/19/2021   PT End of Session - 01/19/21 1428     Visit Number 23    Date for PT Re-Evaluation 01/31/21   PN 11/22/20   PT Start Time 1305    PT Stop Time 1400    PT Time Calculation (min) 55 min    Activity Tolerance Patient tolerated treatment well    Behavior During Therapy [facility] for tasks assessed/performed             No past medical history on file.  No past surgical history on file.  There were no vitals filed for this visit.   Subjective Assessment - 01/19/21 1425     Subjective Pt reported pelvic pain during intercourse.    Pertinent History Hx of twisted ankles R and L,  fall on ice skating in 2013 on R buttock, TMJ, not perinmenopausal yet but periods are shorter    Limitations Lifting    Patient Stated Goals improving skeletal health, bone health and balance                [facility] PT Assessment - 01/19/21 1428       Coordination   Coordination and Movement Description limited excursion of diaphragm                        Pelvic Floor Special Questions - 01/19/21 1425     Pelvic Floor Internal Exam pt consented verbally and had no contraindications    Exam Type Vaginal    Palpation tightness 1-3 layers initially, after guided breathing and anterior tilt of pelvis, pt demo'd improved lengthening. tenderness at lateral border of bladder, lowered position of bladder.    Strength fair squeeze, definite lift               [facility] Adult PT Treatment/Exercise - 01/19/21 1426       Therapeutic Activites    Other Therapeutic Activities guided relaxation in restorative posture, explained applying mindfulness with self  practice with pelvic floor      Neuro Re-ed    Neuro Re-ed Details  cued for mindfulness training, relaxation practice with restorative yoga pose, cued for less upper ab overuse to promote upper position of bladder      Modalities   Modalities Moist Heat      Moist Heat Therapy   Number Minutes Moist Heat 5 Minutes    Moist Heat Location --   perineum, guided restorative posture and relaxation     Manual Therapy   Internal Pelvic Floor STM/MWM to promote upward position of bladder, biofeedback for lengthening of pelvic floor                          PT Long Term Goals - 01/12/21 1309       PT LONG TERM GOAL #1   Title Pt will demo proper body mechanics to minimize straining of abdomen and pelvic floor with fitness routine (modifications to sit-up/ crunches) ( weight lifting)    Time 4    Period Weeks    Status On-going      PT LONG TERM GOAL #2   Title Pt will report no  LBP occuring across across one month to continue with fitness and ADLs activities    Time 8    Period Weeks    Status Achieved      PT LONG TERM GOAL #3   Title Pt will make modifcations to her sitting posture/ work station at work in order to minimize overactivity of pelvic floor    Time 8    Period Weeks    Status Achieved      PT LONG TERM GOAL #4   Title Pt will demo proper deep core coordination with proper diaphragmatic excursion in order to optimize postural stability and pelvic floor function    Time 10    Period Weeks    Status Achieved      PT LONG TERM GOAL #5   Title Pt will report no straining with bowel movements 100% of the time and improved boewl frequency from 3-4x/week to daily or every other day across 2 weeks in order to restore pelvic floor function    Time 6    Period Weeks    Status Achieved      PT LONG TERM GOAL #6   Title Pt will demo decreased abdominal separation from 3 fingers width above umbilicus to < 1 fingers width and increased lateral diaphragmatic  excursion in order to progress to proper pelvic floor lengthening to minimize pelvic pain and restore bowel movements and sexual function without difficulty    Time 10    Period Weeks    Status Achieved      PT LONG TERM GOAL #7   Title Pt will report decreased TMJ pain from 4/10 to < 1/10 and be IND with relaxation practices in order to increase QOL    Time 8    Period Weeks    Status On-going      PT LONG TERM GOAL #8   Title Pt will demo modifications to fitness routine with IND and proper alignment to strengthen underused mm systems to minimize risk for injuries    Time 10    Period Weeks    Status On-going      PT LONG TERM GOAL  #9   TITLE Pt will demo no tightness of pelvic floor mm across 2 visits in order restore pelvic function    Time 10    Period Weeks    Status On-going                   Plan - 01/19/21 1428     Clinical Impression Statement Pt 's internal assessment showed tensions of pelvic floor mm but pt was able to lengthen them with guided relaxation, mindfulness, and proper coordination of deep core with less overuse of upper abdominal mm. Pt also required pillow under pelvis with cues to promote more upward position of bladder outside of pubic symphysis inside introitus. Pt demo'd positive response to restorative posture and mindfulness technique applied to pelvic floor and demo'd more expansion of pelvic floor and diaphragm.  Plan to reassess at next session.   Pt continues to benefit from skilled PT    Personal Factors and Comorbidities Fitness;Other    Stability/Clinical Decision Making Evolving/Moderate complexity    Rehab Potential Good    PT Frequency 1x / week    PT Duration Other (comment)   10   PT Treatment/Interventions Balance training;Neuromuscular re-education;Gait training;Moist Heat;Functional mobility training;Therapeutic activities;Patient/family education;Manual techniques;Therapeutic exercise;Taping;Spinal Manipulations;Joint  Manipulations;Scar mobilization;Stair training;Traction;Energy conservation    Consulted and Agree with Plan of Care  Patient             Patient will benefit from skilled therapeutic intervention in order to improve the following deficits and impairments:  Increased muscle spasms, Decreased mobility, Decreased coordination, Decreased endurance, Decreased activity tolerance, Decreased range of motion, Decreased strength, Improper body mechanics, Pain, Postural dysfunction  Visit Diagnosis: Sacrococcygeal disorders, not elsewhere classified  Other muscle spasm  Other lack of coordination  Chronic bilateral low back pain without sciatica  TMJ dysfunction  Diastasis recti     Problem List There are no problems to display for this patient.   Mariane Masters, PT 01/19/2021, 2:31 PM  [facility] MAIN [facility] SERVICES [address] [place], [place], [address] Phone: [phone]   Fax:  [phone]  Name: Dorothy Owens MRN: [id number] Date of Birth: Sep 13, 1970

## 2021-01-19 NOTE — Patient Instructions (Signed)
Perform deep core with pillow under pelvis   Apply :   Practice proper pelvic floor coordination  Inhale: expand pelvic floor muscles Exhale" "j" scoop, allow pelvic floor to close, lift first before belly sinks   ( not "draw abdominal muscle to spine" or strain with abdominal muscles")

## 2021-01-26 ENCOUNTER — Other Ambulatory Visit: Payer: Self-pay

## 2021-01-26 ENCOUNTER — Ambulatory Visit: Payer: BC Managed Care – PPO | Admitting: Physical Therapy

## 2021-01-26 DIAGNOSIS — R278 Other lack of coordination: Secondary | ICD-10-CM

## 2021-01-26 DIAGNOSIS — M6208 Separation of muscle (nontraumatic), other site: Secondary | ICD-10-CM

## 2021-01-26 DIAGNOSIS — M26609 Unspecified temporomandibular joint disorder, unspecified side: Secondary | ICD-10-CM

## 2021-01-26 DIAGNOSIS — M62838 Other muscle spasm: Secondary | ICD-10-CM

## 2021-01-26 DIAGNOSIS — G8929 Other chronic pain: Secondary | ICD-10-CM

## 2021-01-26 DIAGNOSIS — M533 Sacrococcygeal disorders, not elsewhere classified: Secondary | ICD-10-CM

## 2021-01-27 NOTE — Therapy (Signed)
[place] [facility] MAIN [facility] SERVICES [address] [place], [place], [address] Phone: [phone]   Fax:  [phone]  Physical Therapy Treatment  Patient Details  Name: Dorothy Owens MRN: [id number] Date of Birth: 04-26-1971 Referring Provider (PT): Christeen Douglas   Encounter Date: 01/26/2021   PT End of Session - 01/27/21 1049     Visit Number 24    Date for PT Re-Evaluation 01/31/21   PN 11/22/20   PT Start Time 1300    PT Stop Time 1400    PT Time Calculation (min) 60 min    Activity Tolerance Patient tolerated treatment well    Behavior During Therapy [facility] for tasks assessed/performed             No past medical history on file.  No past surgical history on file.  There were no vitals filed for this visit.   Subjective Assessment - 01/26/21 1305     Subjective Pt reported the slipping in the L low rib has not happened as much    Pertinent History Hx of twisted ankles R and L,  fall on ice skating in 2013 on R buttock, TMJ, not perinmenopausal yet but periods are shorter    Limitations Lifting    Patient Stated Goals improving skeletal health, bone health and balance                [facility] PT Assessment - 01/27/21 1146       Coordination   Coordination and Movement Description limited laterior/ lateral excursion of diaphragm      Palpation   Palpation comment tensions at low abdomen                        Pelvic Floor Special Questions - 01/27/21 1149     Pelvic Floor Internal Exam pt consented verbally and had no contraindications    Exam Type Vaginal    Palpation no tightness 1-3 layers initially, after guided breathing and anterior tilt of pelvis, pt demo'd improved lengthening. tenderness at lateral border of bladder, lowered position of bladder.    Strength fair squeeze, definite lift               [facility] Adult PT Treatment/Exercise - 01/27/21 1149       Therapeutic Activites    Other  Therapeutic Activities relaxation practices to minimize abdominal tensions      Neuro Re-ed    Neuro Re-ed Details  guided for abdominal massage to soften abdomen , relaxation practices      Modalities   Modalities Moist Heat      Moist Heat Therapy   Moist Heat Location --   guided restorative posture and relaxation, abdomen     Manual Therapy   Internal Pelvic Floor STM/MWM to promote upward position of bladder, biofeedback for lengthening of pelvic floor and abdominal wall                          PT Long Term Goals - 01/12/21 1309       PT LONG TERM GOAL #1   Title Pt will demo proper body mechanics to minimize straining of abdomen and pelvic floor with fitness routine (modifications to sit-up/ crunches) ( weight lifting)    Time 4    Period Weeks    Status On-going      PT LONG TERM GOAL #2   Title Pt will report no LBP  occuring across across one month to continue with fitness and ADLs activities    Time 8    Period Weeks    Status Achieved      PT LONG TERM GOAL #3   Title Pt will make modifcations to her sitting posture/ work station at work in order to minimize overactivity of pelvic floor    Time 8    Period Weeks    Status Achieved      PT LONG TERM GOAL #4   Title Pt will demo proper deep core coordination with proper diaphragmatic excursion in order to optimize postural stability and pelvic floor function    Time 10    Period Weeks    Status Achieved      PT LONG TERM GOAL #5   Title Pt will report no straining with bowel movements 100% of the time and improved boewl frequency from 3-4x/week to daily or every other day across 2 weeks in order to restore pelvic floor function    Time 6    Period Weeks    Status Achieved      PT LONG TERM GOAL #6   Title Pt will demo decreased abdominal separation from 3 fingers width above umbilicus to < 1 fingers width and increased lateral diaphragmatic excursion in order to progress to proper pelvic  floor lengthening to minimize pelvic pain and restore bowel movements and sexual function without difficulty    Time 10    Period Weeks    Status Achieved      PT LONG TERM GOAL #7   Title Pt will report decreased TMJ pain from 4/10 to < 1/10 and be IND with relaxation practices in order to increase QOL    Time 8    Period Weeks    Status On-going      PT LONG TERM GOAL #8   Title Pt will demo modifications to fitness routine with IND and proper alignment to strengthen underused mm systems to minimize risk for injuries    Time 10    Period Weeks    Status On-going      PT LONG TERM GOAL  #9   TITLE Pt will demo no tightness of pelvic floor mm across 2 visits in order restore pelvic function    Time 10    Period Weeks    Status On-going                   Plan - 01/27/21 1050     Clinical Impression Statement Pt reported her L low rib is not slipping out of place as often which is sign of improvement. Focused on minimizing abdominal tensions with massage massage which pt demo'd IND with training.  Added deep core strengthening today  Pt demo'd decreased mm tightness of pelvic floor but will still require more Tx to minimize slightly lowered position of bladder.   Pt continues to benefit from skilled PT.    Personal Factors and Comorbidities Fitness;Other    Stability/Clinical Decision Making Evolving/Moderate complexity    Rehab Potential Good    PT Frequency 1x / week    PT Duration Other (comment)   10   PT Treatment/Interventions Balance training;Neuromuscular re-education;Gait training;Moist Heat;Functional mobility training;Therapeutic activities;Patient/family education;Manual techniques;Therapeutic exercise;Taping;Spinal Manipulations;Joint Manipulations;Scar mobilization;Stair training;Traction;Energy conservation    Consulted and Agree with Plan of Care Patient             Patient will benefit from skilled therapeutic intervention in order to improve the  following deficits and impairments:  Increased muscle spasms, Decreased mobility, Decreased coordination, Decreased endurance, Decreased activity tolerance, Decreased range of motion, Decreased strength, Improper body mechanics, Pain, Postural dysfunction  Visit Diagnosis: Sacrococcygeal disorders, not elsewhere classified  Other muscle spasm  Other lack of coordination  Chronic bilateral low back pain without sciatica  TMJ dysfunction  Diastasis recti     Problem List There are no problems to display for this patient.   Mariane Masters, PT 01/27/2021, 11:55 AM  [facility] MAIN [facility] SERVICES [address] [place], [place], [address] Phone: [phone]   Fax:  [phone]  Name: Dorothy Owens MRN: [id number] Date of Birth: 1971-02-05

## 2021-02-08 ENCOUNTER — Encounter: Payer: BC Managed Care – PPO | Admitting: Physical Therapy

## 2021-02-15 ENCOUNTER — Other Ambulatory Visit: Payer: Self-pay

## 2021-02-15 ENCOUNTER — Ambulatory Visit: Payer: BC Managed Care – PPO | Attending: Obstetrics and Gynecology | Admitting: Physical Therapy

## 2021-02-15 DIAGNOSIS — M26609 Unspecified temporomandibular joint disorder, unspecified side: Secondary | ICD-10-CM | POA: Diagnosis not present

## 2021-02-15 DIAGNOSIS — R278 Other lack of coordination: Secondary | ICD-10-CM | POA: Diagnosis present

## 2021-02-15 DIAGNOSIS — G8929 Other chronic pain: Secondary | ICD-10-CM | POA: Insufficient documentation

## 2021-02-15 DIAGNOSIS — M533 Sacrococcygeal disorders, not elsewhere classified: Secondary | ICD-10-CM | POA: Diagnosis present

## 2021-02-15 DIAGNOSIS — M6208 Separation of muscle (nontraumatic), other site: Secondary | ICD-10-CM | POA: Diagnosis present

## 2021-02-15 DIAGNOSIS — M545 Low back pain, unspecified: Secondary | ICD-10-CM | POA: Diagnosis present

## 2021-02-15 DIAGNOSIS — M62838 Other muscle spasm: Secondary | ICD-10-CM | POA: Insufficient documentation

## 2021-02-15 NOTE — Therapy (Signed)
[place] MAIN [facility] SERVICES [address] [place], [place], [address] Phone: [phone]   Fax:  [phone]  Physical Therapy Treatment  Patient Details  Name: Dorothy Owens MRN: [id number] Date of Birth: [birth year] Referring Provider (PT): Benjaman Kindler   Encounter Date: 02/15/2021   PT End of Session - 02/15/21 1358     Visit Number 25    Date for PT Re-Evaluation 04/26/21   PN 11/22/20   PT Start Time 1304    PT Stop Time 1400    PT Time Calculation (min) 56 min    Activity Tolerance Patient tolerated treatment well    Behavior During Therapy [facility] for tasks assessed/performed             No past medical history on file.  No past surgical history on file.  There were no vitals filed for this visit.   Subjective Assessment - 02/15/21 1308     Subjective Pt reported L rib   is popping less. Pt did not perform certain and modified gym exercises to minimize shoulder pain.    Pertinent History Hx of twisted ankles R and L,  fall on ice skating in 2013 on R buttock, TMJ, not perinmenopausal yet but periods are shorter    Limitations Lifting    Patient Stated Goals improving skeletal health, bone health and balance                [facility] PT Assessment - 02/15/21 1325       Coordination   Coordination and Movement Description delayed downward rotation L scapula in shoulder adduction from abduction, poor coordination of cervical spine, tendency for R sideflexion , poor retraction      AROM   Overall AROM Comments Cervical Rotaion 45 deg L, 50 deg R,  sideflexion 45 deg B      Palpation   Palpation comment tightness at posterior scalenes, interspinals of cervical spine, deviated L of T1, occipital mm B                           [facility] Adult PT Treatment/Exercise - 02/15/21 1357       Therapeutic Activites    Other Therapeutic Activities --      Neuro Re-ed    Neuro Re-ed Details  cued for cervical  retraction, propioception      Modalities   Modalities Moist Heat      Moist Heat Therapy   Number Minutes Moist Heat 5 Minutes    Moist Heat Location --   cervical spine     Manual Therapy   Internal Pelvic Floor STM/MWM to lengthening of cervical spine, retraction                          PT Long Term Goals - 02/15/21 1310       PT LONG TERM GOAL #1   Title Pt will demo proper body mechanics to minimize straining of abdomen and pelvic floor with fitness routine (modifications to sit-up/ crunches) ( weight lifting)    Time 4    Period Weeks    Status Achieved      PT LONG TERM GOAL #2   Title Pt will report no LBP occuring across across one month to continue with fitness and ADLs activities    Time 8    Period Weeks    Status Partially Met  PT LONG TERM GOAL #3   Title Pt will make modifcations to her sitting posture/ work station at work in order to minimize overactivity of pelvic floor    Time 8    Period Weeks    Status Achieved      PT LONG TERM GOAL #4   Title Pt will demo proper deep core coordination with proper diaphragmatic excursion in order to optimize postural stability and pelvic floor function    Time 10    Period Weeks    Status Achieved      PT LONG TERM GOAL #5   Title Pt will report no straining with bowel movements 100% of the time and improved boewl frequency from 3-4x/week to daily or every other day across 2 weeks in order to restore pelvic floor function    Time 6    Period Weeks    Status Achieved      PT LONG TERM GOAL #6   Title Pt will demo decreased abdominal separation from 3 fingers width above umbilicus to < 1 fingers width and increased lateral diaphragmatic excursion in order to progress to proper pelvic floor lengthening to minimize pelvic pain and restore bowel movements and sexual function without difficulty    Time 10    Period Weeks    Status Achieved      PT LONG TERM GOAL #7   Title Pt will report  decreased TMJ pain from 4/10 to < 1/10 and be IND with relaxation practices in order to increase QOL    Time 8    Period Weeks    Status On-going      PT LONG TERM GOAL #8   Title Pt will demo modifications to fitness routine with IND and proper alignment to strengthen underused mm systems to minimize risk for injuries    Time 10    Period Weeks    Status Partially Met      PT LONG TERM GOAL  #9   TITLE Pt will demo no tightness of pelvic floor mm across 2 visits in order restore pelvic function    Time 10    Period Weeks    Status On-going                   Plan - 02/15/21 1400     Clinical Impression Statement Pt has achieved 5/9 goals and progress well towards remaining goals. Pt has demonstrated a better aligned spine and pelvis with less rotation of thorax. Pt has modified her gym routine which is helping pt minimize shoulder and thoracic pain and also is aware of improved deep core activation when participating in work and hobby activities. Focused on TMJ and cervical area today. Pt demo'd poor propioception for retraction and increased tensions at L, deviated T2, and tendency for R sideflexion. Pt demo'd increased rotation AROM post Tx. Plan to continue building propioception training and fitness training. Plan to resume pelvic floor Tx at next session. Pt continues to benefit from skilled PT.    Personal Factors and Comorbidities Fitness;Other    Stability/Clinical Decision Making Evolving/Moderate complexity    Rehab Potential Good    PT Frequency 1x / week    PT Duration Other (comment)   10   PT Treatment/Interventions Balance training;Neuromuscular re-education;Gait training;Moist Heat;Functional mobility training;Therapeutic activities;Patient/family education;Manual techniques;Therapeutic exercise;Taping;Spinal Manipulations;Joint Manipulations;Scar mobilization;Stair training;Traction;Energy conservation    Consulted and Agree with Plan of Care Patient  Patient will benefit from skilled therapeutic intervention in order to improve the following deficits and impairments:  Increased muscle spasms, Decreased mobility, Decreased coordination, Decreased endurance, Decreased activity tolerance, Decreased range of motion, Decreased strength, Improper body mechanics, Pain, Postural dysfunction  Visit Diagnosis: Sacrococcygeal disorders, not elsewhere classified  Other muscle spasm  Other lack of coordination  Chronic bilateral low back pain without sciatica  TMJ dysfunction  Diastasis recti     Problem List There are no problems to display for this patient.   Jerl Mina, PT 02/15/2021, 4:33 PM  [place] MAIN [facility] SERVICES [address] [place], [place], [address] Phone: [phone]   Fax:  [phone]  Name: Dorothy Owens MRN: [id number] Date of Birth: 09/20/1970

## 2021-02-22 ENCOUNTER — Ambulatory Visit: Payer: BC Managed Care – PPO | Admitting: Physical Therapy

## 2021-02-22 ENCOUNTER — Other Ambulatory Visit: Payer: Self-pay

## 2021-02-22 DIAGNOSIS — M26609 Unspecified temporomandibular joint disorder, unspecified side: Secondary | ICD-10-CM | POA: Diagnosis not present

## 2021-02-22 DIAGNOSIS — M533 Sacrococcygeal disorders, not elsewhere classified: Secondary | ICD-10-CM

## 2021-02-22 NOTE — Patient Instructions (Signed)
Stretches : (Cuing provided for proper alignment)  Instructions start with Strap on R    Stretches for your legs: LAYING on Back Use upper arms and elbows for stability when pulling strap Opposite knee bent and foot firm in align with hip   Strap on ballm[place]:  Hip socket  _strap, L knee bent, R ballmound against strap and spread toes, rolling foot 15 deg out and in across midline.  10 reps each side   Hamstring _knee bends  10 reps  With knee pointing straight ( slightly to outside to minimize snapping sensation)      10 reps with knee pointing out towards armpit ( notice the stretch in the medial hamstring muscle)     IT band  _scoot hips to R, cross R leg over L and straighten knee with strap on [place],   Bend knee back and forth 5x    Quad in sidelying _strap around the ankle, pulling ankle towards buttocks  Bottom leg firm and stabilization with knee bent  Adductors and pelvic floor ( Happy Baby) : _knees are wide towards armpits, sole of feet towards ceiling   On your back again:  Strap under R thigh Hip abductors ( figure 4)      ,L ankle over R thigh ( stretching L glut)     5  Breaths   

## 2021-02-22 NOTE — Therapy (Signed)
[place] MAIN [facility] SERVICES [address] [place], [place], [address] Phone: [phone]   Fax:  [phone]  Physical Therapy Treatment  Patient Details  Name: Dorothy Owens MRN: [id number] Date of Birth: [birth year] Referring Provider (PT): Benjaman Kindler   Encounter Date: 02/22/2021   PT End of Session - 02/22/21 1311     Visit Number 26    Date for PT Re-Evaluation 04/26/21   PN 11/22/20   PT Start Time 1302    PT Stop Time 1400    PT Time Calculation (min) 58 min    Activity Tolerance Patient tolerated treatment well    Behavior During Therapy [facility] for tasks assessed/performed             No past medical history on file.  No past surgical history on file.  There were no vitals filed for this visit.   Subjective Assessment - 02/22/21 1312     Subjective Pt reported her midback feels tight. B knee cap pain today and while  doing [place] split squat yesterday    Pertinent History Hx of twisted ankles R and L,  fall on ice skating in 2013 on R buttock, TMJ, not perinmenopausal yet but periods are shorter    Limitations Lifting    Patient Stated Goals improving skeletal health, bone health and balance                [facility] PT Assessment - 02/22/21 1356       Palpation   Spinal mobility less tightness at R occiput, hypomobile T3-5, posterior scalenes/ levator B    Palpation comment tightness at lateral leg/ ITband, peroneal longus/ brevis. tib Theron Arista                           [facility] Adult PT Treatment/Exercise - 02/22/21 1357       Neuro Re-ed    Neuro Re-ed Details  cued for lower kinetic chain chain stretches      Modalities   Modalities Moist Heat      Moist Heat Therapy   Number Minutes Moist Heat 5 Minutes    Moist Heat Location --   cervical, during inrustion of HEP     Manual Therapy   Internal Pelvic Floor STM/MWM at mm at cervical/ throacic, inferior glide at clavicle, distractino  at occiput , posterior mob Grade III with ER at tib fib, STM/MWM at IT band/ peronal longus/ brevis                          PT Long Term Goals - 02/15/21 1310       PT LONG TERM GOAL #1   Title Pt will demo proper body mechanics to minimize straining of abdomen and pelvic floor with fitness routine (modifications to sit-up/ crunches) ( weight lifting)    Time 4    Period Weeks    Status Achieved      PT LONG TERM GOAL #2   Title Pt will report no LBP occuring across across one month to continue with fitness and ADLs activities    Time 8    Period Weeks    Status Partially Met      PT LONG TERM GOAL #3   Title Pt will make modifcations to her sitting posture/ work station at work in order to minimize overactivity of pelvic floor    Time 8  Period Weeks    Status Achieved      PT LONG TERM GOAL #4   Title Pt will demo proper deep core coordination with proper diaphragmatic excursion in order to optimize postural stability and pelvic floor function    Time 10    Period Weeks    Status Achieved      PT LONG TERM GOAL #5   Title Pt will report no straining with bowel movements 100% of the time and improved boewl frequency from 3-4x/week to daily or every other day across 2 weeks in order to restore pelvic floor function    Time 6    Period Weeks    Status Achieved      PT LONG TERM GOAL #6   Title Pt will demo decreased abdominal separation from 3 fingers width above umbilicus to < 1 fingers width and increased lateral diaphragmatic excursion in order to progress to proper pelvic floor lengthening to minimize pelvic pain and restore bowel movements and sexual function without difficulty    Time 10    Period Weeks    Status Achieved      PT LONG TERM GOAL #7   Title Pt will report decreased TMJ pain from 4/10 to < 1/10 and be IND with relaxation practices in order to increase QOL    Time 8    Period Weeks    Status On-going      PT LONG TERM GOAL #8    Title Pt will demo modifications to fitness routine with IND and proper alignment to strengthen underused mm systems to minimize risk for injuries    Time 10    Period Weeks    Status Partially Met      PT LONG TERM GOAL  #9   TITLE Pt will demo no tightness of pelvic floor mm across 2 visits in order restore pelvic function    Time 10    Period Weeks    Status On-going                   Plan - 02/22/21 1311     Clinical Impression Statement Pt required manual Tx to further minimize cervical/ jaw and shoulder tensions. Pt displayed less L jaw popping and anticipate more improvement with more retraction cervical / scapulothoracic strengthening. Pt required manual Tx at IT band and lateral leg to minimize knee pain with [place] squats. Pt demo'd no pain post Tx.  Added lower kientic chain stretches to minimize tightness of ITband/ lateral leg. Pt continues to benefit from skilled PT    Personal Factors and Comorbidities Fitness;Other    Stability/Clinical Decision Making Evolving/Moderate complexity    Rehab Potential Good    PT Frequency 1x / week    PT Duration Other (comment)   10   PT Treatment/Interventions Balance training;Neuromuscular re-education;Gait training;Moist Heat;Functional mobility training;Therapeutic activities;Patient/family education;Manual techniques;Therapeutic exercise;Taping;Spinal Manipulations;Joint Manipulations;Scar mobilization;Stair training;Traction;Energy conservation    Consulted and Agree with Plan of Care Patient             Patient will benefit from skilled therapeutic intervention in order to improve the following deficits and impairments:  Increased muscle spasms, Decreased mobility, Decreased coordination, Decreased endurance, Decreased activity tolerance, Decreased range of motion, Decreased strength, Improper body mechanics, Pain, Postural dysfunction  Visit Diagnosis: Sacrococcygeal disorders, not elsewhere  classified     Problem List There are no problems to display for this patient.   Jerl Mina, PT 02/22/2021, 2:03 PM  [place] MAIN [facility] SERVICES [place], [place], [address] Phone: [phone]   Fax:  [phone]  Name: Dorothy Owens MRN: [id number] Date of Birth: 09-06-1970

## 2021-03-01 ENCOUNTER — Other Ambulatory Visit: Payer: Self-pay

## 2021-03-01 ENCOUNTER — Ambulatory Visit: Payer: BC Managed Care – PPO | Attending: Obstetrics and Gynecology | Admitting: Physical Therapy

## 2021-03-01 DIAGNOSIS — G8929 Other chronic pain: Secondary | ICD-10-CM | POA: Insufficient documentation

## 2021-03-01 DIAGNOSIS — R278 Other lack of coordination: Secondary | ICD-10-CM | POA: Insufficient documentation

## 2021-03-01 DIAGNOSIS — M62838 Other muscle spasm: Secondary | ICD-10-CM | POA: Insufficient documentation

## 2021-03-01 DIAGNOSIS — M6208 Separation of muscle (nontraumatic), other site: Secondary | ICD-10-CM | POA: Insufficient documentation

## 2021-03-01 DIAGNOSIS — M545 Low back pain, unspecified: Secondary | ICD-10-CM | POA: Diagnosis present

## 2021-03-01 DIAGNOSIS — M533 Sacrococcygeal disorders, not elsewhere classified: Secondary | ICD-10-CM | POA: Insufficient documentation

## 2021-03-01 DIAGNOSIS — M26609 Unspecified temporomandibular joint disorder, unspecified side: Secondary | ICD-10-CM | POA: Insufficient documentation

## 2021-03-01 NOTE — Patient Instructions (Addendum)
  __  Add more thoracolumbar strengthening:  [place] cobra [place] on wall   Bands facing the door  - hands back past pockets, stepping back  10 reps B   -romaninan dead lift position with hands out , thumbs up , armpits squeezing

## 2021-03-01 NOTE — Therapy (Signed)
[place] MAIN [facility] SERVICES [address] [place], [place], [address] Phone: [phone]   Fax:  [phone]  Physical Therapy Treatment  Patient Details  Name: Dorothy Owens MRN: [id number] Date of Birth: 05-11-1970 Referring Provider (PT): Benjaman Kindler   Encounter Date: 03/01/2021   PT End of Session - 03/01/21 1414     Visit Number 27    Date for PT Re-Evaluation 04/26/21   PN 11/22/20   PT Start Time 6767    PT Stop Time 1410    PT Time Calculation (min) 65 min    Activity Tolerance Patient tolerated treatment well    Behavior During Therapy [facility] for tasks assessed/performed             No past medical history on file.  No past surgical history on file.  There were no vitals filed for this visit.   Subjective Assessment - 03/01/21 1305     Subjective Pt reported 90% better with B knee cap pain with teh modifications from last session. Pt is paying attention to alignment of knees and stretching IT band. Pt started pull up with the band and push ups  close on knees. Pt feels something in the sternum related R shoulder. L rib is still popping but less. Pt's jaw still pops and she keeps her head back. 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    Pertinent History Hx of twisted ankles R and L,  fall on ice skating in 2013 on R buttock, TMJ, not perinmenopausal yet but periods are shorter    Limitations Lifting    Patient Stated Goals improving skeletal health, bone health and balance                [facility] PT Assessment - 03/01/21 1654       Observation/Other Assessments   Observations anterior COM, without cues needed,      Coordination   Coordination and Movement Description limited diaphragmatic excursion/ anterior ribs      Other:   Other/Comments RDL with poor knee alignment      Palpation   Palpation comment R upper trap, medial scap tightness /paraspinal at  thoracic / levator/ SCM/scalenes                           [facility] Adult PT Treatment/Exercise - 03/01/21 1656       Neuro Re-ed    Neuro Re-ed Details  cued for with bands  thoracolumbar strengthening      Modalities   Modalities Moist Heat      Moist Heat Therapy   Number Minutes Moist Heat 5 Minutes    Moist Heat Location --   cervical     Manual Therapy   Internal Pelvic Floor STM/MWM at problem area noted in assessment to promote more diaphragm excursion, depression of R thorax/ shoulders                          PT Long Term Goals - 02/15/21 1310       PT LONG TERM GOAL #1   Title Pt will demo proper body mechanics to minimize straining of abdomen and pelvic floor with fitness routine (modifications to sit-up/ crunches) ( weight lifting)    Time 4    Period Weeks    Status Achieved      PT LONG TERM GOAL #  2   Title Pt will report no LBP occuring across across one month to continue with fitness and ADLs activities    Time 8    Period Weeks    Status Partially Met      PT LONG TERM GOAL #3   Title Pt will make modifcations to her sitting posture/ work station at work in order to minimize overactivity of pelvic floor    Time 8    Period Weeks    Status Achieved      PT LONG TERM GOAL #4   Title Pt will demo proper deep core coordination with proper diaphragmatic excursion in order to optimize postural stability and pelvic floor function    Time 10    Period Weeks    Status Achieved      PT LONG TERM GOAL #5   Title Pt will report no straining with bowel movements 100% of the time and improved boewl frequency from 3-4x/week to daily or every other day across 2 weeks in order to restore pelvic floor function    Time 6    Period Weeks    Status Achieved      PT LONG TERM GOAL #6   Title Pt will demo decreased abdominal separation from 3 fingers width above umbilicus to < 1 fingers width and increased lateral diaphragmatic excursion  in order to progress to proper pelvic floor lengthening to minimize pelvic pain and restore bowel movements and sexual function without difficulty    Time 10    Period Weeks    Status Achieved      PT LONG TERM GOAL #7   Title Pt will report decreased TMJ pain from 4/10 to < 1/10 and be IND with relaxation practices in order to increase QOL    Time 8    Period Weeks    Status On-going      PT LONG TERM GOAL #8   Title Pt will demo modifications to fitness routine with IND and proper alignment to strengthen underused mm systems to minimize risk for injuries    Time 10    Period Weeks    Status Partially Met      PT LONG TERM GOAL  #9   TITLE Pt will demo no tightness of pelvic floor mm across 2 visits in order restore pelvic function    Time 10    Period Weeks    Status On-going                   Plan - 03/01/21 1415     Clinical Impression Statement Pt demo'd increased upper thoracic/ shoulder mm tensions as she has started to return to pull up and push ups. Manual Tx helped to minimize tensions and pt was recommended to balance her workout routine with more scapolothoracic/ lumbar thoracic strengthening HEP. Pt demo'd improved multidifis and lumbar back mm strength with less lumbar lordosis and no more trunk pertubation with posterior mm testing. Plan to reassess pelvic floor next session. Pt continues to benefit from skilled PT.    Personal Factors and Comorbidities Fitness;Other    Stability/Clinical Decision Making Evolving/Moderate complexity    Rehab Potential Good    PT Frequency 1x / week    PT Duration Other (comment)   10   PT Treatment/Interventions Balance training;Neuromuscular re-education;Gait training;Moist Heat;Functional mobility training;Therapeutic activities;Patient/family education;Manual techniques;Therapeutic exercise;Taping;Spinal Manipulations;Joint Manipulations;Scar mobilization;Stair training;Traction;Energy conservation    Consulted and Agree  with Plan of Care Patient  Patient will benefit from skilled therapeutic intervention in order to improve the following deficits and impairments:  Increased muscle spasms, Decreased mobility, Decreased coordination, Decreased endurance, Decreased activity tolerance, Decreased range of motion, Decreased strength, Improper body mechanics, Pain, Postural dysfunction  Visit Diagnosis: Sacrococcygeal disorders, not elsewhere classified  Other muscle spasm  Other lack of coordination  Chronic bilateral low back pain without sciatica  TMJ dysfunction  Diastasis recti     Problem List There are no problems to display for this patient.   Jerl Mina, PT 03/01/2021, 5:02 PM  [place] MAIN [facility] SERVICES [address] [place], [place], [address] Phone: [phone]   Fax:  [phone]  Name: Mikaiya Tramble MRN: [id number] Date of Birth: [birth year]

## 2021-03-08 ENCOUNTER — Other Ambulatory Visit: Payer: Self-pay

## 2021-03-08 ENCOUNTER — Ambulatory Visit: Payer: BC Managed Care – PPO | Admitting: Physical Therapy

## 2021-03-08 DIAGNOSIS — M533 Sacrococcygeal disorders, not elsewhere classified: Secondary | ICD-10-CM | POA: Diagnosis not present

## 2021-03-08 DIAGNOSIS — M545 Low back pain, unspecified: Secondary | ICD-10-CM

## 2021-03-08 DIAGNOSIS — G8929 Other chronic pain: Secondary | ICD-10-CM

## 2021-03-08 DIAGNOSIS — M26609 Unspecified temporomandibular joint disorder, unspecified side: Secondary | ICD-10-CM

## 2021-03-08 DIAGNOSIS — M6208 Separation of muscle (nontraumatic), other site: Secondary | ICD-10-CM

## 2021-03-08 DIAGNOSIS — M62838 Other muscle spasm: Secondary | ICD-10-CM

## 2021-03-08 DIAGNOSIS — R278 Other lack of coordination: Secondary | ICD-10-CM

## 2021-03-08 NOTE — Therapy (Signed)
[place] MAIN [facility] SERVICES [address] [place], [place], [address] Phone: [phone]   Fax:  [phone]  Physical Therapy Treatment  Patient Details  Name: Dorothy Owens MRN: [id number] Date of Birth: Jan 10, 1971 Referring Provider (PT): Benjaman Kindler   Encounter Date: 03/08/2021   PT End of Session - 03/08/21 1306     Visit Number 28    Date for PT Re-Evaluation 04/26/21   PN 11/22/20   PT Start Time 1303    PT Stop Time 1403    PT Time Calculation (min) 60 min    Activity Tolerance Patient tolerated treatment well    Behavior During Therapy [facility] for tasks assessed/performed             No past medical history on file.  No past surgical history on file.  There were no vitals filed for this visit.   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 Her neck has been bothering her during the day.    Pertinent History Hx of twisted ankles R and L,  fall on ice skating in 2013 on R buttock, TMJ, not perinmenopausal yet but periods are shorter    Limitations Lifting    Patient Stated Goals improving skeletal health, bone health and balance                [facility] PT Assessment - 03/08/21 1754       Observation/Other Assessments   Observations L no jaw popping with depression seated and supine with cues for cervical retraction/ scapular depression,      AROM   Overall AROM Comments Cervical Rotation  50 deg B,  sideflexion 45 deg B      Strength   Overall Strength Comments cervical endurance 2:53  with c/o low back tightness and cervical occiput tensions      Palpation   Palpation comment limited lateral excursion of L ribs, upper trap/ middle /anterior scalenes/ increased tensions L                           [facility] Adult PT Treatment/Exercise - 03/08/21 1758       Neuro Re-ed    Neuro Re-ed  Details  cued for cervical/ scapular retraction/ depression, new resistance band HEP for strengthening lumbar. decrease lordosis      Modalities   Modalities Moist Heat      Moist Heat Therapy   Number Minutes Moist Heat 5 Minutes    Moist Heat Location --   cervical/ thoracic ( unbilled)     Manual Therapy   Internal Pelvic Floor STM/MWM at problem area noted in assessment to promote more diaphragm excursion, depression of  L thorax/ shoulder                          PT Long Term Goals - 03/08/21 1756       PT LONG TERM GOAL #1   Title Pt will demo proper body mechanics to minimize straining of abdomen and pelvic floor with fitness routine (modifications to sit-up/ crunches) ( weight lifting)    Time 4    Period Weeks    Status Achieved      PT LONG TERM GOAL #2   Title Pt will report no LBP occuring across across one month  to continue with fitness and ADLs activities    Time 8    Period Weeks    Status Partially Met      PT LONG TERM GOAL #3   Title Pt will make modifcations to her sitting posture/ work station at work in order to minimize overactivity of pelvic floor    Time 8    Period Weeks    Status Achieved      PT LONG TERM GOAL #4   Title Pt will demo proper deep core coordination with proper diaphragmatic excursion in order to optimize postural stability and pelvic floor function    Time 10    Period Weeks    Status Achieved      PT LONG TERM GOAL #5   Title Pt will report no straining with bowel movements 100% of the time and improved boewl frequency from 3-4x/week to daily or every other day across 2 weeks in order to restore pelvic floor function    Time 6    Period Weeks    Status Achieved      Additional Long Term Goals   Additional Long Term Goals Yes      PT LONG TERM GOAL #6   Title Pt will demo decreased abdominal separation from 3 fingers width above umbilicus to < 1 fingers width and increased lateral diaphragmatic excursion in  order to progress to proper pelvic floor lengthening to minimize pelvic pain and restore bowel movements and sexual function without difficulty    Time 10    Period Weeks    Status Achieved      PT LONG TERM GOAL #7   Title Pt will report decreased TMJ pain from 4/10 to < 1/10 and be IND with relaxation practices in order to increase QOL    Time 8    Period Weeks    Status On-going      PT LONG TERM GOAL #8   Title Pt will demo modifications to fitness routine with IND and proper alignment to strengthen underused mm systems to minimize risk for injuries    Time 10    Period Weeks    Status Partially Met      PT LONG TERM GOAL  #9   TITLE Pt will demo no tightness of pelvic floor mm across 2 visits in order restore pelvic function    Time 10    Period Weeks    Status On-going      PT LONG TERM GOAL  #10   TITLE Pt will demo increased cervical endurance test from 2:53 to > 3:30  in order to improve head posture and decreased overuse of  upper trap/ scalenes to return to ull ups with less risk of injuries    Time 10    Period Weeks    Status New    Target Date 05/17/21                   Plan - 03/08/21 1757     Clinical Impression Statement Pt demo'd improvement with less L jaw popping after manual Tx and neuromuscular reeducation to increase cervical retraction/ scapular depression/ retraction. Anticipate today's Tx will help minimize the L neck complaints and minimize overuse of upper n eck/ shoulder mm and prepare her to return to pull ups with less risk for relapse and injuries.  Pt continues to benefit from skilled PT.    Personal Factors and Comorbidities Fitness;Other    Stability/Clinical Decision Making Evolving/Moderate complexity  Rehab Potential Good    PT Frequency 1x / week    PT Duration Other (comment)   10   PT Treatment/Interventions Balance training;Neuromuscular re-education;Gait training;Moist Heat;Functional mobility training;Therapeutic  activities;Patient/family education;Manual techniques;Therapeutic exercise;Taping;Spinal Manipulations;Joint Manipulations;Scar mobilization;Stair training;Traction;Energy conservation    Consulted and Agree with Plan of Care Patient             Patient will benefit from skilled therapeutic intervention in order to improve the following deficits and impairments:  Increased muscle spasms, Decreased mobility, Decreased coordination, Decreased endurance, Decreased activity tolerance, Decreased range of motion, Decreased strength, Improper body mechanics, Pain, Postural dysfunction  Visit Diagnosis: Sacrococcygeal disorders, not elsewhere classified  Other muscle spasm  Other lack of coordination  Chronic bilateral low back pain without sciatica  TMJ dysfunction  Diastasis recti     Problem List There are no problems to display for this patient.   Jerl Mina, PT 03/08/2021, 5:59 PM  [place] MAIN [facility] SERVICES [address] [place], [place], [address] Phone: [phone]   Fax:  [phone]  Name: Dorothy Owens MRN: [id number] Date of Birth: May 21, 1970

## 2021-03-08 NOTE — Patient Instructions (Signed)
Pilow under belly  Wonderwoman position, thumbs out   Step by step: Sense your points of contact  Exhale, shoulders down, chin tuck,  20 reps   To minimzie low back   __

## 2021-03-15 ENCOUNTER — Encounter: Payer: BC Managed Care – PPO | Admitting: Physical Therapy

## 2021-03-22 ENCOUNTER — Other Ambulatory Visit: Payer: Self-pay

## 2021-03-22 ENCOUNTER — Ambulatory Visit: Payer: BC Managed Care – PPO | Admitting: Physical Therapy

## 2021-03-22 DIAGNOSIS — R278 Other lack of coordination: Secondary | ICD-10-CM

## 2021-03-22 DIAGNOSIS — M62838 Other muscle spasm: Secondary | ICD-10-CM

## 2021-03-22 DIAGNOSIS — M6208 Separation of muscle (nontraumatic), other site: Secondary | ICD-10-CM

## 2021-03-22 DIAGNOSIS — G8929 Other chronic pain: Secondary | ICD-10-CM

## 2021-03-22 DIAGNOSIS — M26609 Unspecified temporomandibular joint disorder, unspecified side: Secondary | ICD-10-CM

## 2021-03-22 DIAGNOSIS — M533 Sacrococcygeal disorders, not elsewhere classified: Secondary | ICD-10-CM | POA: Diagnosis not present

## 2021-03-22 DIAGNOSIS — M545 Low back pain, unspecified: Secondary | ICD-10-CM

## 2021-03-23 NOTE — Therapy (Signed)
[place] MAIN [facility] SERVICES [address] [place], [place], [address] Phone: [phone]   Fax:  [phone]  Physical Therapy Treatment  Patient Details  Name: Dorothy Owens MRN: [id number] Date of Birth: 1971-02-17 Referring Provider (PT): Benjaman Kindler   Encounter Date: 03/22/2021   PT End of Session - 03/23/21 0905     Visit Number 29    Date for PT Re-Evaluation 04/26/21   PN 11/22/20   PT Start Time 1303    PT Stop Time 1400    PT Time Calculation (min) 57 min    Activity Tolerance Patient tolerated treatment well    Behavior During Therapy [facility] for tasks assessed/performed             No past medical history on file.  No past surgical history on file.  There were no vitals filed for this visit.   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  Ice and heat helped and advil.  Pt feels low back pain today. Neck and midback are sore from her workout yesterday    Pertinent History Hx of twisted ankles R and L,  fall on ice skating in 2013 on R buttock, TMJ, not perinmenopausal yet but periods are shorter    Limitations Lifting    Patient Stated Goals improving skeletal health, bone health and balance                [facility] PT Assessment - 03/22/21 1313       AROM   Overall AROM Comments Cervical Rotation  50 deg L, 45 deg R,  sideflexion 45 deg B                        Pelvic Floor Special Questions - 03/22/21 1345     Pelvic Floor Internal Exam pt consented verbally and had no contraindications    Exam Type Vaginal    Palpation bladder position more cranial position behind pubic symphysis, tightness at L ischiocavernosus /bulbospongiosus    Strength good squeeze, good lift, able to hold agaisnt strong resistance   proper lengthening, deep core coordination without cues              [facility] Adult PT  Treatment/Exercise - 03/23/21 1100       Therapeutic Activites    Other Therapeutic Activities reassessed previous areas of tensions at neck/ thoracic/lumbar which showed no tensions      Modalities   Modalities Moist Heat      Moist Heat Therapy   Moist Heat Location --   perineum, in legs propped, guided restorative yoga principles     Manual Therapy   Internal Pelvic Floor STM/MWM at problem area noted in assessment to promote more diaphragm excursion, depression of  L thorax/ shoulder                          PT Long Term Goals - 03/08/21 1756       PT LONG TERM GOAL #1   Title Pt will demo proper body mechanics to minimize straining of abdomen and pelvic floor with fitness routine (modifications to sit-up/ crunches) ( weight lifting)    Time 4    Period Weeks    Status Achieved      PT LONG TERM GOAL #2   Title  Pt will report no LBP occuring across across one month to continue with fitness and ADLs activities    Time 8    Period Weeks    Status Partially Met      PT LONG TERM GOAL #3   Title Pt will make modifcations to her sitting posture/ work station at work in order to minimize overactivity of pelvic floor    Time 8    Period Weeks    Status Achieved      PT LONG TERM GOAL #4   Title Pt will demo proper deep core coordination with proper diaphragmatic excursion in order to optimize postural stability and pelvic floor function    Time 10    Period Weeks    Status Achieved      PT LONG TERM GOAL #5   Title Pt will report no straining with bowel movements 100% of the time and improved boewl frequency from 3-4x/week to daily or every other day across 2 weeks in order to restore pelvic floor function    Time 6    Period Weeks    Status Achieved      Additional Long Term Goals   Additional Long Term Goals Yes      PT LONG TERM GOAL #6   Title Pt will demo decreased abdominal separation from 3 fingers width above umbilicus to < 1 fingers width  and increased lateral diaphragmatic excursion in order to progress to proper pelvic floor lengthening to minimize pelvic pain and restore bowel movements and sexual function without difficulty    Time 10    Period Weeks    Status Achieved      PT LONG TERM GOAL #7   Title Pt will report decreased TMJ pain from 4/10 to < 1/10 and be IND with relaxation practices in order to increase QOL    Time 8    Period Weeks    Status On-going      PT LONG TERM GOAL #8   Title Pt will demo modifications to fitness routine with IND and proper alignment to strengthen underused mm systems to minimize risk for injuries    Time 10    Period Weeks    Status Partially Met      PT LONG TERM GOAL  #9   TITLE Pt will demo no tightness of pelvic floor mm across 2 visits in order restore pelvic function    Time 10    Period Weeks    Status On-going      PT LONG TERM GOAL  #10   TITLE Pt will demo increased cervical endurance test from 2:53 to > 3:30  in order to improve head posture and decreased overuse of  upper trap/ scalenes to return to ull ups with less risk of injuries    Time 10    Period Weeks    Status New    Target Date 05/17/21                   Plan - 03/23/21 0905     Clinical Impression Statement Pt demo'd no popping of L jaw today during assessment in supine, cervical spine AROM bilaterally improved, no more forward head posture, and no increased tensions along paraspinals. These are improvements of proper alignment and strength in deeper musculature.   Reassessed pelvic floor which also showed less tensions. Pt only required manual Tx to minimize tightness at L anterior mm.   Plan to focus on fitness routine at upcoming  sessions Pt continues to benefit from skilled PT     Personal Factors and Comorbidities Fitness;Other    Stability/Clinical Decision Making Evolving/Moderate complexity    Rehab Potential Good    PT Frequency 1x / week    PT Duration Other (comment)   10    PT Treatment/Interventions Balance training;Neuromuscular re-education;Gait training;Moist Heat;Functional mobility training;Therapeutic activities;Patient/family education;Manual techniques;Therapeutic exercise;Taping;Spinal Manipulations;Joint Manipulations;Scar mobilization;Stair training;Traction;Energy conservation    Consulted and Agree with Plan of Care Patient             Patient will benefit from skilled therapeutic intervention in order to improve the following deficits and impairments:  Increased muscle spasms, Decreased mobility, Decreased coordination, Decreased endurance, Decreased activity tolerance, Decreased range of motion, Decreased strength, Improper body mechanics, Pain, Postural dysfunction  Visit Diagnosis: Sacrococcygeal disorders, not elsewhere classified  Other muscle spasm  Other lack of coordination  Chronic bilateral low back pain without sciatica  TMJ dysfunction  Diastasis recti     Problem List There are no problems to display for this patient.   Jerl Mina, PT 03/23/2021, 11:03 AM  [place] MAIN [facility] SERVICES [address] [place], [place], [address] Phone: [phone]   Fax:  [phone]  Name: Dorothy Owens MRN: [id number] Date of Birth: 1970-10-19

## 2021-03-29 ENCOUNTER — Encounter: Payer: BC Managed Care – PPO | Admitting: Physical Therapy

## 2021-04-05 ENCOUNTER — Ambulatory Visit: Payer: BC Managed Care – PPO | Attending: Obstetrics and Gynecology | Admitting: Physical Therapy

## 2021-04-05 ENCOUNTER — Other Ambulatory Visit: Payer: Self-pay

## 2021-04-05 DIAGNOSIS — R278 Other lack of coordination: Secondary | ICD-10-CM | POA: Diagnosis present

## 2021-04-05 DIAGNOSIS — M533 Sacrococcygeal disorders, not elsewhere classified: Secondary | ICD-10-CM

## 2021-04-05 DIAGNOSIS — M545 Low back pain, unspecified: Secondary | ICD-10-CM

## 2021-04-05 DIAGNOSIS — M26609 Unspecified temporomandibular joint disorder, unspecified side: Secondary | ICD-10-CM

## 2021-04-05 DIAGNOSIS — G8929 Other chronic pain: Secondary | ICD-10-CM | POA: Insufficient documentation

## 2021-04-05 DIAGNOSIS — M62838 Other muscle spasm: Secondary | ICD-10-CM | POA: Diagnosis present

## 2021-04-05 DIAGNOSIS — M6208 Separation of muscle (nontraumatic), other site: Secondary | ICD-10-CM | POA: Diagnosis present

## 2021-04-06 NOTE — Therapy (Signed)
[place] MAIN [facility] SERVICES [address] [place], [place], [address] Phone: [phone]   Fax:  [phone]  Physical Therapy Treatment /Progress Note from 12/22/20  Visit 20 to Visit 30 04/05/21  Patient Details  Name: Dorothy Owens MRN: [id number] Date of Birth: 15-Mar-1971 Referring Provider (PT): Benjaman Kindler   Encounter Date: 04/05/2021   PT End of Session - 04/05/21 1307     Visit Number 30    Date for PT Re-Evaluation 04/26/21   PN 11/22/20,PN 12/22/20,  PN 04/05/21   PT Start Time 1302    PT Stop Time 1400    PT Time Calculation (min) 58 min    Activity Tolerance Patient tolerated treatment well    Behavior During Therapy [facility] for tasks assessed/performed             No past medical history on file.  No past surgical history on file.  There were no vitals filed for this visit.   Subjective Assessment - 04/06/21 1946     Subjective Today is the 30th visit. Pt had no bleeding with sexual intercourse which is an improvement. Pt did feel pain at the bottom of vagina and felt her bladder in the way.    Pertinent History Hx of twisted ankles R and L,  fall on ice skating in 2013 on R buttock, TMJ, not perinmenopausal yet but periods are shorter    Limitations Lifting    Patient Stated Goals improving skeletal health, bone health and balance                [facility] PT Assessment - 04/06/21 1947       Coordination   Coordination and Movement Description proper coordination with deep core, not more abdominal holding             Internal Pelvic floor assessment: noted tightness of pelvic floor with report of stinging. Pt able to lengthen pelvic floor properly. Pelvic posterior tilt with more report of pain compared to anterior tilt. Bladder slightly lowered position by pubic symphysis,               [facility] Adult PT Treatment/Exercise - 04/06/21 1947       Therapeutic Activites    Other Therapeutic  Activities reassessed goals, referred pt for sex therapist as pt noticed there was psychological asociations with pelvic floor tightness./ tensions/ stinging sensation      Neuro Re-ed    Neuro Re-ed Details  cued for relaxation / breathing techniques with pelvic floor , mindfulness technique and pain science education     Manual Therapy   Internal Pelvic Floor guided breathing and stability techniques for lengthening of pelvic floor                          PT Long Term Goals - 04/05/21 1307       PT LONG TERM GOAL #1   Title Pt will demo proper body mechanics to minimize straining of abdomen and pelvic floor with fitness routine (modifications to sit-up/ crunches) ( weight lifting)    Time 4    Period Weeks    Status Partially Met      PT LONG TERM GOAL #2   Title Pt will report no LBP occuring across across one month to continue with fitness and ADLs activities  ( 04/05/21: no more radiating pain and only soreness at times)    Time 8    Period Weeks  Status Achieved      PT LONG TERM GOAL #3   Title Pt will make modifcations to her sitting posture/ work station at work in order to minimize overactivity of pelvic floor    Time 8    Period Weeks    Status Achieved      PT LONG TERM GOAL #4   Title Pt will demo proper deep core coordination with proper diaphragmatic excursion in order to optimize postural stability and pelvic floor function    Time 10    Period Weeks    Status Achieved      PT LONG TERM GOAL #5   Title Pt will report no straining with bowel movements 100% of the time and improved boewl frequency from 3-4x/week to daily or every other day across 2 weeks in order to restore pelvic floor function    Time 6    Period Weeks    Status Achieved      PT LONG TERM GOAL #6   Title Pt will demo decreased abdominal separation from 3 fingers width above umbilicus to < 1 fingers width and increased lateral diaphragmatic excursion in order to progress to  proper pelvic floor lengthening to minimize pelvic pain and restore bowel movements and sexual function without difficulty    Time 10    Period Weeks    Status Achieved      PT LONG TERM GOAL #7   Title Pt will report decreased TMJ pain from 4/10 to < 1/10 and be IND with relaxation practices in order to increase QOL  ( 04/05/21: 3/10)    Time 8    Period Weeks    Status Partially Met      PT LONG TERM GOAL #8   Title Pt will demo modifications to fitness routine with IND and proper alignment to strengthen underused mm systems to minimize risk for injuries    Time 10    Period Weeks    Status Achieved      PT LONG TERM GOAL  #9   TITLE Pt will demo no tightness of pelvic floor mm across 2 visits in order restore pelvic function    Time 10    Period Weeks    Status On-going      PT LONG TERM GOAL  #10   TITLE Pt will demo increased cervical endurance test from 2:53 to > 3:30  in order to improve head posture and decreased overuse of  upper trap/ scalenes to return to ull ups with less risk of injuries  ( 04/05/21                                             3: 55 sec)    Time 10    Period Weeks    Status Achieved    Target Date 05/17/21                   Plan - 04/06/21 1946     Clinical Impression Statement Patient has achieved 7 out of 10 goals across the past 30 visits and progressing towards remaining goals.  Patient's bowel movements have improved. Pt experienced no bleeding with sexual intercourse but still experienced pain. Today's assessment showed more tensions, posterior tilt of pelvic floor, report of burning and stinginess and a slightly lower position of bladder compared to last session where pt presented  with no pelvic floor mm tensions and no lowered position of bladder. Pt reported she did incorporate forms of sit-up/ crunches in addition to sexual activity between these two PT sessions. Pt was referred to sexual therapist to help with pt's psychological  contributing factors that she has voiced. Pt was guided through mindfulness technique and pain science education to help pt build more awareness with pelvic floor mm. Continue to further modify and educate pt on workout to minimize lowering of bladder position.  Pt's posture has improved at the next with more cervical endurance muscle strength and less popping at the jaw in previously tested positions.  Patient shows stronger scapular strength and coordination and understands how to modify her workout routine to minimize thoracic/ rib/ shoulder issues. Explained to pt on the importance of not overstrengthening global upper body mm and importance of balancing pull ups with lat/ scapular stabilization mm strengthening to optimize deep core system for bowel function , pelvic organ position, and sexual function.   Pt is still needing more education on modifications to her workout to minimize pelvic Sx.   Pt continues to benefit from skilled PT    Personal Factors and Comorbidities Fitness;Other    Stability/Clinical Decision Making Evolving/Moderate complexity    Rehab Potential Good    PT Frequency 1x / week    PT Duration Other (comment)   10   PT Treatment/Interventions Balance training;Neuromuscular re-education;Gait training;Moist Heat;Functional mobility training;Therapeutic activities;Patient/family education;Manual techniques;Therapeutic exercise;Taping;Spinal Manipulations;Joint Manipulations;Scar mobilization;Stair training;Traction;Energy conservation    Consulted and Agree with Plan of Care Patient             Patient will benefit from skilled therapeutic intervention in order to improve the following deficits and impairments:  Increased muscle spasms, Decreased mobility, Decreased coordination, Decreased endurance, Decreased activity tolerance, Decreased range of motion, Decreased strength, Improper body mechanics, Pain, Postural dysfunction  Visit Diagnosis: Sacrococcygeal  disorders, not elsewhere classified  Other muscle spasm  Chronic bilateral low back pain without sciatica  Other lack of coordination  TMJ dysfunction  Diastasis recti     Problem List There are no problems to display for this patient.   Jerl Mina, PT 04/06/2021, 8:01 PM  [place] MAIN [facility] SERVICES [address] [place], [place], [address] Phone: [phone]   Fax:  [phone]  Name: Patrese Neal MRN: [id number] Date of Birth: [birth year]

## 2021-04-12 ENCOUNTER — Encounter: Payer: BC Managed Care – PPO | Admitting: Physical Therapy

## 2021-04-19 ENCOUNTER — Encounter: Payer: BC Managed Care – PPO | Admitting: Physical Therapy

## 2021-04-26 ENCOUNTER — Encounter: Payer: BC Managed Care – PPO | Admitting: Physical Therapy

## 2021-05-04 ENCOUNTER — Ambulatory Visit: Payer: BC Managed Care – PPO | Attending: Obstetrics and Gynecology | Admitting: Physical Therapy

## 2021-05-04 ENCOUNTER — Other Ambulatory Visit: Payer: Self-pay

## 2021-05-04 DIAGNOSIS — M62838 Other muscle spasm: Secondary | ICD-10-CM | POA: Insufficient documentation

## 2021-05-04 DIAGNOSIS — G8929 Other chronic pain: Secondary | ICD-10-CM | POA: Diagnosis present

## 2021-05-04 DIAGNOSIS — M26609 Unspecified temporomandibular joint disorder, unspecified side: Secondary | ICD-10-CM | POA: Insufficient documentation

## 2021-05-04 DIAGNOSIS — M6208 Separation of muscle (nontraumatic), other site: Secondary | ICD-10-CM | POA: Diagnosis present

## 2021-05-04 DIAGNOSIS — M533 Sacrococcygeal disorders, not elsewhere classified: Secondary | ICD-10-CM | POA: Insufficient documentation

## 2021-05-04 DIAGNOSIS — R278 Other lack of coordination: Secondary | ICD-10-CM | POA: Insufficient documentation

## 2021-05-04 DIAGNOSIS — M545 Low back pain, unspecified: Secondary | ICD-10-CM | POA: Diagnosis present

## 2021-05-04 NOTE — Patient Instructions (Addendum)
Mini steps to pull up and chin up  __  Blue band behind head, w position  Mini squat, knees behind toes, out along 2-3rd toes line 10 reps  Gaze at our [place] on [place]  Chin tuck, band "combs hair"   __  Half 'w" , band over       door,stand perpendicular to door  15reps  With exhale  ___   Dolphin / mini squat To activate low abs  ___  Table top, blue band under opp hand Like Owen's video  Do more on L   __  Tricep dips on bench   GOOD JOB  on no more sway back!!!!

## 2021-05-05 NOTE — Therapy (Addendum)
[place] MAIN [facility] SERVICES [address] [place], [place], [address] Phone: [phone]   Fax:  [phone]  Physical Therapy Treatment  Patient Details  Name: Dorothy Owens MRN: [id number] Date of Birth: 03-20-1971 Referring Provider (PT): Benjaman Kindler   Encounter Date: 05/04/2021   PT End of Session - 05/04/21 1311     Visit Number 31    Date for PT Re-Evaluation 07/13/21   PN 11/22/20,PN 12/22/20,  PN 40/9/73, recert 09/01/27   PT Start Time 1307    PT Stop Time 1403    PT Time Calculation (min) 56 min    Activity Tolerance Patient tolerated treatment well    Behavior During Therapy [facility] for tasks assessed/performed             No past medical history on file.  No past surgical history on file.  There were no vitals filed for this visit.   Subjective Assessment - 05/04/21 1312     Subjective Pt reported she feels ok with shoulder. midback pain over the past month. The frequency and intensity of the catch by the L shoulder blade is less but it still occurs.    Pertinent History Hx of twisted ankles R and L,  fall on ice skating in 2013 on R buttock, TMJ, not perinmenopausal yet but periods are shorter    Limitations Lifting    Patient Stated Goals improving skeletal health, bone health and balance                [facility] PT Assessment - 05/05/21 1249       Coordination   Coordination and Movement Description poor knee alignment insquat, no more lumbar lordosis inmost fitness exercises, no scapular dyskinesis present with shoulder abduction      Other:   Other/Comments pull up bicep version: with hipflexion,  shoulder abduction version: one rep, eccentric control present.  Plank with feet on chair with shakiness ,                           [facility] Adult PT Treatment/Exercise - 05/05/21 1250       Therapeutic Activites    Other Therapeutic Activities explained and discussed principles of fitness  exercises to minimzie relapse of pain,      Neuro Re-ed    Neuro Re-ed Details  cued for for knee alignment in minisquat, scap retraction/ cervical retraction in new HEP with restistance band ,. co-activation of low abd                          PT Long Term Goals - 05/04/21 1319       PT LONG TERM GOAL #1   Title Pt will demo proper body mechanics to minimize straining of abdomen and pelvic floor with fitness routine (modifications to sit-up/ crunches) ( weight lifting)    Time 4    Period Weeks    Status Partially Met      PT LONG TERM GOAL #2   Title Pt will report no LBP occuring across across one month to continue with fitness and ADLs activities  ( 04/05/21: no more radiating pain and only soreness at times)    Time 8    Period Weeks    Status Achieved      PT LONG TERM GOAL #3   Title Pt will make modifcations to her sitting posture/ work station at work in  order to minimize overactivity of pelvic floor    Time 8    Period Weeks    Status Achieved      PT LONG TERM GOAL #4   Title Pt will demo proper deep core coordination with proper diaphragmatic excursion in order to optimize postural stability and pelvic floor function    Time 10    Period Weeks    Status Achieved      PT LONG TERM GOAL #5   Title Pt will report no straining with bowel movements 100% of the time and improved boewl frequency from 3-4x/week to daily or every other day across 2 weeks in order to restore pelvic floor function    Time 6    Period Weeks    Status Achieved      Additional Long Term Goals   Additional Long Term Goals Yes      PT LONG TERM GOAL #6   Title Pt will demo decreased abdominal separation from 3 fingers width above umbilicus to < 1 fingers width and increased lateral diaphragmatic excursion in order to progress to proper pelvic floor lengthening to minimize pelvic pain and restore bowel movements and sexual function without difficulty    Time 10    Period Weeks     Status Achieved      PT LONG TERM GOAL #7   Title Pt will report decreased TMJ pain from 4/10 to < 1/10 and be IND with relaxation practices in order to increase QOL  ( 04/05/21: 3/10,  05/04/21:  3/10    Time 8    Period Weeks    Status Partially Met      PT LONG TERM GOAL #8   Title Pt will demo modifications to fitness routine with IND and proper alignment to strengthen underused mm systems to minimize risk for injuries    Time 10    Period Weeks    Status Achieved      PT LONG TERM GOAL  #9   TITLE Pt will demo no tightness of pelvic floor mm across 2 visits in order restore pelvic function    Time 10    Period Weeks    Status On-going      PT LONG TERM GOAL  #10   TITLE Pt will demo increased cervical endurance test from 2:53 to > 3:30  in order to improve head posture and decreased overuse of  upper trap/ scalenes to return to ull ups with less risk of injuries  ( 04/05/21                                             3: 55 sec)    Time 10    Period Weeks    Status Achieved    Target Date 05/17/21      PT LONG TERM GOAL  #11   TITLE Pt will demo no upper trap overuse with proper cervical retraction and deep shoulder / thoraco mm co-activation with 3 pullup ( shoulderabduction) without issues after one set across     2 days    Time 10    Period Weeks    Status New    Target Date 07/14/21                   Plan - 05/04/21 1312     Clinical Impression Statement Pt achieved  6/11 goals and progressing well towards remaining goals. Pt demo'd decreased pelvic floor mm tightness and proper coordination with deep core mm.  Pt demonstrated significantly improved posture and more postural stability. Pt not longer requires cues for less lumbar lordosis in fitness exercises which indicate improved deep core and back strength.  Pt is still requiring skilled PT to address alignment and technique to achieve pull ups in her fitness routine.  Anticipate pt will progress towards this  goal and remaining goals with new HEP added today. Pt required cues for form and technique for HEP and demo'd correctly postTx. Pt benefits from skilled PT.    Personal Factors and Comorbidities Fitness;Other    Stability/Clinical Decision Making Evolving/Moderate complexity    Rehab Potential Good    PT Frequency 1x / week    PT Duration Other (comment)   10   PT Treatment/Interventions Balance training;Neuromuscular re-education;Gait training;Moist Heat;Functional mobility training;Therapeutic activities;Patient/family education;Manual techniques;Therapeutic exercise;Taping;Spinal Manipulations;Joint Manipulations;Scar mobilization;Stair training;Traction;Energy conservation    Consulted and Agree with Plan of Care Patient             Patient will benefit from skilled therapeutic intervention in order to improve the following deficits and impairments:  Increased muscle spasms, Decreased mobility, Decreased coordination, Decreased endurance, Decreased activity tolerance, Decreased range of motion, Decreased strength, Improper body mechanics, Pain, Postural dysfunction  Visit Diagnosis: Sacrococcygeal disorders, not elsewhere classified  Other muscle spasm  Chronic bilateral low back pain without sciatica  Other lack of coordination  TMJ dysfunction  Diastasis recti     Problem List There are no problems to display for this patient.   Jerl Mina, PT 05/05/2021, 12:55 PM  [place] MAIN [facility] SERVICES [address] [place], [place], [address] Phone: [phone]   Fax:  [phone]  Name: Cythia Bachtel MRN: [id number] Date of Birth: February 10, 1971

## 2021-05-09 NOTE — Addendum Note (Signed)
Addended by: Mariane Masters on: 05/09/2021 03:48 PM   Modules accepted: Orders

## 2021-05-11 ENCOUNTER — Other Ambulatory Visit: Payer: Self-pay

## 2021-05-11 ENCOUNTER — Ambulatory Visit: Payer: BC Managed Care – PPO | Admitting: Physical Therapy

## 2021-05-11 DIAGNOSIS — M62838 Other muscle spasm: Secondary | ICD-10-CM | POA: Diagnosis not present

## 2021-05-11 DIAGNOSIS — M545 Low back pain, unspecified: Secondary | ICD-10-CM

## 2021-05-11 DIAGNOSIS — M26609 Unspecified temporomandibular joint disorder, unspecified side: Secondary | ICD-10-CM

## 2021-05-11 DIAGNOSIS — M533 Sacrococcygeal disorders, not elsewhere classified: Secondary | ICD-10-CM

## 2021-05-11 DIAGNOSIS — R278 Other lack of coordination: Secondary | ICD-10-CM

## 2021-05-11 NOTE — Therapy (Signed)
[place] MAIN [facility] SERVICES [address] [place], [place], [address] Phone: [phone]   Fax:  [phone]  Physical Therapy Treatment  Patient Details  Name: Dorothy Owens MRN: [id number] Date of Birth: [birth year] Referring Provider (PT): Benjaman Kindler   Encounter Date: 05/11/2021   PT End of Session - 05/11/21 1311     Visit Number 32    Date for PT Re-Evaluation 07/13/21   PN 11/22/20,PN 12/22/20,  PN 28/7/86, recert 11/04/70   PT Start Time 1305    PT Stop Time 1400    PT Time Calculation (min) 55 min    Activity Tolerance Patient tolerated treatment well    Behavior During Therapy [facility] for tasks assessed/performed             No past medical history on file.  No past surgical history on file.  There were no vitals filed for this visit.   Subjective Assessment - 05/11/21 1308     Subjective Pt reported soreness after sexual intercourse and she practiced relaxing her neck, pelvic and ab muscles. Pt feels upper trap tightness after her gym workout. Ptdid 8 reps of pulls up x3 sets with use of band this past week    Pertinent History Hx of twisted ankles R and L,  fall on ice skating in 2013 on R buttock, TMJ, not perinmenopausal yet but periods are shorter    Limitations Lifting    Patient Stated Goals improving skeletal health, bone health and balance                               [facility] Adult PT Treatment/Exercise - 05/11/21 1450       Therapeutic Activites    Other Therapeutic Activities explained anatomy and physiology of shoulder complex and hand position impacting uppertrap shoulder issues      Neuro Re-ed    Neuro Re-ed Details  cued for stretches for posterior UE used in pull ups , expansion of intercostals to minimize thoracic hypomobility, cued for scapular retraction/depression with UE resistance band exericses, cued for thoracic extensions      Manual Therapy   Manual therapy comments  medial glide at upper thoracic T4, STM at intercostals T10-12 , interspinsals B                          PT Long Term Goals - 05/04/21 1319       PT LONG TERM GOAL #1   Title Pt will demo proper body mechanics to minimize straining of abdomen and pelvic floor with fitness routine (modifications to sit-up/ crunches) ( weight lifting)    Time 4    Period Weeks    Status Partially Met      PT LONG TERM GOAL #2   Title Pt will report no LBP occuring across across one month to continue with fitness and ADLs activities  ( 04/05/21: no more radiating pain and only soreness at times)    Time 8    Period Weeks    Status Achieved      PT LONG TERM GOAL #3   Title Pt will make modifcations to her sitting posture/ work station at work in order to minimize overactivity of pelvic floor    Time 8    Period Weeks    Status Achieved      PT LONG TERM GOAL #4   Title Pt  will demo proper deep core coordination with proper diaphragmatic excursion in order to optimize postural stability and pelvic floor function    Time 10    Period Weeks    Status Achieved      PT LONG TERM GOAL #5   Title Pt will report no straining with bowel movements 100% of the time and improved boewl frequency from 3-4x/week to daily or every other day across 2 weeks in order to restore pelvic floor function    Time 6    Period Weeks    Status Achieved      Additional Long Term Goals   Additional Long Term Goals Yes      PT LONG TERM GOAL #6   Title Pt will demo decreased abdominal separation from 3 fingers width above umbilicus to < 1 fingers width and increased lateral diaphragmatic excursion in order to progress to proper pelvic floor lengthening to minimize pelvic pain and restore bowel movements and sexual function without difficulty    Time 10    Period Weeks    Status Achieved      PT LONG TERM GOAL #7   Title Pt will report decreased TMJ pain from 4/10 to < 1/10 and be IND with relaxation  practices in order to increase QOL  ( 04/05/21: 3/10,  05/04/21:  3/10    Time 8    Period Weeks    Status Partially Met      PT LONG TERM GOAL #8   Title Pt will demo modifications to fitness routine with IND and proper alignment to strengthen underused mm systems to minimize risk for injuries    Time 10    Period Weeks    Status Achieved      PT LONG TERM GOAL  #9   TITLE Pt will demo no tightness of pelvic floor mm across 2 visits in order restore pelvic function    Time 10    Period Weeks    Status On-going      PT LONG TERM GOAL  #10   TITLE Pt will demo increased cervical endurance test from 2:53 to > 3:30  in order to improve head posture and decreased overuse of  upper trap/ scalenes to return to ull ups with less risk of injuries  ( 04/05/21                                             3: 55 sec)    Time 10    Period Weeks    Status Achieved    Target Date 05/17/21      PT LONG TERM GOAL  #11   TITLE Pt will demo no upper trap overuse with proper cervical retraction and deep shoulder / thoraco mm co-activation with 3 pullup ( shoulderabduction) without issues after one set across     2 days    Time 10    Period Weeks    Status New    Target Date 07/14/21                   Plan - 05/11/21 1454     Clinical Impression Statement Pt was able to perform 24 pull ups with band assistance at the feet during her gym workout. Pt returned with relapse of intercostal tightness and thoracic segment deviation and required cues for less  upper trap overuse,  thoracic extension/lumbar lordosis in exercises.  Manual Tx addressed her deviations. Stretches were provided to counteract the thoracic tightness that occurred with pull ups.  Advised pt to perform   pull ups at 5 reps x3 instead of 8 reps x 3 per week next 2 weeks while she strengthens her lats and shoulder complex to minimize overuse of upper traps.   Pt voiced understanding. Plan to strengthen thoracic endurance mm next  session.  Pt continues to benefit from skilled PT.    Personal Factors and Comorbidities Fitness;Other    Stability/Clinical Decision Making Evolving/Moderate complexity    Rehab Potential Good    PT Frequency 1x / week    PT Duration Other (comment)   10   PT Treatment/Interventions Balance training;Neuromuscular re-education;Gait training;Moist Heat;Functional mobility training;Therapeutic activities;Patient/family education;Manual techniques;Therapeutic exercise;Taping;Spinal Manipulations;Joint Manipulations;Scar mobilization;Stair training;Traction;Energy conservation    Consulted and Agree with Plan of Care Patient             Patient will benefit from skilled therapeutic intervention in order to improve the following deficits and impairments:  Increased muscle spasms, Decreased mobility, Decreased coordination, Decreased endurance, Decreased activity tolerance, Decreased range of motion, Decreased strength, Improper body mechanics, Pain, Postural dysfunction  Visit Diagnosis: Sacrococcygeal disorders, not elsewhere classified  Other muscle spasm  Chronic bilateral low back pain without sciatica  Other lack of coordination  TMJ dysfunction     Problem List There are no problems to display for this patient.   Jerl Mina, PT 05/11/2021, 2:55 PM  [place] MAIN [facility] SERVICES [address] [place], [place], [address] Phone: [phone]   Fax:  [phone]  Name: Dorothy Owens MRN: [id number] Date of Birth: 03-Oct-1970

## 2021-05-11 NOTE — Patient Instructions (Signed)
Dolphin stretch   Counter top stretch   For intercostals and armpit muscles after push ups

## 2021-05-18 ENCOUNTER — Ambulatory Visit: Payer: BC Managed Care – PPO | Admitting: Physical Therapy

## 2021-05-18 ENCOUNTER — Other Ambulatory Visit: Payer: Self-pay

## 2021-05-18 DIAGNOSIS — M62838 Other muscle spasm: Secondary | ICD-10-CM | POA: Diagnosis not present

## 2021-05-18 DIAGNOSIS — G8929 Other chronic pain: Secondary | ICD-10-CM

## 2021-05-18 DIAGNOSIS — R278 Other lack of coordination: Secondary | ICD-10-CM

## 2021-05-18 NOTE — Therapy (Signed)
[place] MAIN [facility] SERVICES [address] [place], [place], [address] Phone: [phone]   Fax:  [phone]  Physical Therapy Treatment  Patient Details  Name: Dorothy Owens MRN: [id number] Date of Birth: Jun 02, 1970 Referring Provider (PT): Benjaman Kindler   Encounter Date: 05/18/2021   PT End of Session - 05/18/21 1416     Visit Number 33    Date for PT Re-Evaluation 07/13/21   PN 11/22/20,PN 12/22/20,  PN 91/6/94, recert 5/0/38   PT Start Time 1305    PT Stop Time 1408    PT Time Calculation (min) 63 min    Activity Tolerance Patient tolerated treatment well    Behavior During Therapy [facility] for tasks assessed/performed             No past medical history on file.  No past surgical history on file.  There were no vitals filed for this visit.   Subjective Assessment - 05/18/21 1309     Subjective Pt noticed her upper traps are getting tight again with her gym workout. Pt performs about ~90 reps exercises that uses pects and upper traps in a workout routine that she attends 3 sessions per week. ( exercises include: overhead press, pull up with band assist, floor press, pulling, planks)    Pertinent History Hx of twisted ankles R and L,  fall on ice skating in 2013 on R buttock, TMJ, not perinmenopausal yet but periods are shorter    Limitations Lifting    Patient Stated Goals improving skeletal health, bone health and balance                [facility] PT Assessment - 05/18/21 1403       Coordination   Coordination and Movement Description poor activation of scapulostabilization , ER of UE quadrant, hyperextended elbows in planks, tricept throws      Palpation   Palpation comment tightness at L upper trap, no deviations to upper thoracic segments                           [facility] Adult PT Treatment/Exercise - 05/18/21 1407       Therapeutic Activites    Other Therapeutic Activities explained modifications  to overall workout to minimzie overuse of upper traps and pect mm to minimzie relapse of mm tightness and posture as she had presented prior to [facility]      Neuro Re-ed    Neuro Re-ed Details  cued for more scapulothoracic stabilization, less hyperextended elbows in body weight exercise                          PT Long Term Goals - 05/04/21 1319       PT LONG TERM GOAL #1   Title Pt will demo proper body mechanics to minimize straining of abdomen and pelvic floor with fitness routine (modifications to sit-up/ crunches) ( weight lifting)    Time 4    Period Weeks    Status Partially Met      PT LONG TERM GOAL #2   Title Pt will report no LBP occuring across across one month to continue with fitness and ADLs activities  ( 04/05/21: no more radiating pain and only soreness at times)    Time 8    Period Weeks    Status Achieved      PT LONG TERM GOAL #3   Title Pt will  make modifcations to her sitting posture/ work station at work in order to minimize overactivity of pelvic floor    Time 8    Period Weeks    Status Achieved      PT LONG TERM GOAL #4   Title Pt will demo proper deep core coordination with proper diaphragmatic excursion in order to optimize postural stability and pelvic floor function    Time 10    Period Weeks    Status Achieved      PT LONG TERM GOAL #5   Title Pt will report no straining with bowel movements 100% of the time and improved boewl frequency from 3-4x/week to daily or every other day across 2 weeks in order to restore pelvic floor function    Time 6    Period Weeks    Status Achieved      Additional Long Term Goals   Additional Long Term Goals Yes      PT LONG TERM GOAL #6   Title Pt will demo decreased abdominal separation from 3 fingers width above umbilicus to < 1 fingers width and increased lateral diaphragmatic excursion in order to progress to proper pelvic floor lengthening to minimize pelvic pain and restore bowel movements and  sexual function without difficulty    Time 10    Period Weeks    Status Achieved      PT LONG TERM GOAL #7   Title Pt will report decreased TMJ pain from 4/10 to < 1/10 and be IND with relaxation practices in order to increase QOL  ( 04/05/21: 3/10,  05/04/21:  3/10    Time 8    Period Weeks    Status Partially Met      PT LONG TERM GOAL #8   Title Pt will demo modifications to fitness routine with IND and proper alignment to strengthen underused mm systems to minimize risk for injuries    Time 10    Period Weeks    Status Achieved      PT LONG TERM GOAL  #9   TITLE Pt will demo no tightness of pelvic floor mm across 2 visits in order restore pelvic function    Time 10    Period Weeks    Status On-going      PT LONG TERM GOAL  #10   TITLE Pt will demo increased cervical endurance test from 2:53 to > 3:30  in order to improve head posture and decreased overuse of  upper trap/ scalenes to return to ull ups with less risk of injuries  ( 04/05/21                                             3: 55 sec)    Time 10    Period Weeks    Status Achieved    Target Date 05/17/21      PT LONG TERM GOAL  #11   TITLE Pt will demo no upper trap overuse with proper cervical retraction and deep shoulder / thoraco mm co-activation with 3 pullup ( shoulderabduction) without issues after one set across     2 days    Time 10    Period Weeks    Status New    Target Date 07/14/21                   Plan -  05/18/21 1416     Clinical Impression Statement Provided modifications to overall workout to minimzie overuse of upper traps and pect mm to minimzie relapse of mm tightness and posture as she had presented prior to [facility]. Cued for more scapulothoracic stabilization, less hyperextended elbows in body weight exercise. Pt did not require cues to decrease lumbar lordosis which signifies more trunk stability.  Plan to assess pt at weight machines next session. Pt continues to benefit from skilled PT     Personal Factors and Comorbidities Fitness;Other    Stability/Clinical Decision Making Evolving/Moderate complexity    Rehab Potential Good    PT Frequency 1x / week    PT Duration Other (comment)   10   PT Treatment/Interventions Balance training;Neuromuscular re-education;Gait training;Moist Heat;Functional mobility training;Therapeutic activities;Patient/family education;Manual techniques;Therapeutic exercise;Taping;Spinal Manipulations;Joint Manipulations;Scar mobilization;Stair training;Traction;Energy conservation    Consulted and Agree with Plan of Care Patient             Patient will benefit from skilled therapeutic intervention in order to improve the following deficits and impairments:  Increased muscle spasms, Decreased mobility, Decreased coordination, Decreased endurance, Decreased activity tolerance, Decreased range of motion, Decreased strength, Improper body mechanics, Pain, Postural dysfunction  Visit Diagnosis: Other muscle spasm  Other lack of coordination  Chronic bilateral low back pain without sciatica     Problem List There are no problems to display for this patient.   Jerl Mina, PT 05/18/2021, 2:21 PM  [place] MAIN [facility] SERVICES [address] [place], [place], [address] Phone: [phone]   Fax:  [phone]  Name: Dorothy Owens MRN: [id number] Date of Birth: 1971-02-03

## 2021-05-25 ENCOUNTER — Ambulatory Visit: Payer: BC Managed Care – PPO | Admitting: Physical Therapy

## 2021-05-25 ENCOUNTER — Other Ambulatory Visit: Payer: Self-pay

## 2021-05-25 DIAGNOSIS — M6208 Separation of muscle (nontraumatic), other site: Secondary | ICD-10-CM

## 2021-05-25 DIAGNOSIS — M62838 Other muscle spasm: Secondary | ICD-10-CM | POA: Diagnosis not present

## 2021-05-25 DIAGNOSIS — M26609 Unspecified temporomandibular joint disorder, unspecified side: Secondary | ICD-10-CM

## 2021-05-25 DIAGNOSIS — R278 Other lack of coordination: Secondary | ICD-10-CM

## 2021-05-25 DIAGNOSIS — M533 Sacrococcygeal disorders, not elsewhere classified: Secondary | ICD-10-CM

## 2021-05-25 DIAGNOSIS — M545 Low back pain, unspecified: Secondary | ICD-10-CM

## 2021-05-26 NOTE — Therapy (Signed)
[place] MAIN [facility] SERVICES [address] [place], [place], [address] Phone: [phone]   Fax:  [phone]  Physical Therapy Treatment  Patient Details  Name: Dorothy Owens MRN: [id number] Date of Birth: 1970/08/21 Referring Provider (PT): Benjaman Kindler   Encounter Date: 05/25/2021   PT End of Session - 05/25/21 1320     Visit Number 34    Date for PT Re-Evaluation 07/13/21   PN 11/22/20,PN 12/22/20,  PN 70/6/23, recert 11/04/26   PT Start Time 1320    PT Stop Time 1415    PT Time Calculation (min) 55 min    Activity Tolerance Patient tolerated treatment well    Behavior During Therapy [facility] for tasks assessed/performed             No past medical history on file.  No past surgical history on file.  There were no vitals filed for this visit.   Subjective Assessment - 05/25/21 1319     Subjective Pt rested and did not go to the gym across 4 days in a role due to lots of social activities.    Pertinent History Hx of twisted ankles R and L,  fall on ice skating in 2013 on R buttock, TMJ, not perinmenopausal yet but periods are shorter    Limitations Lifting    Patient Stated Goals improving skeletal health, bone health and balance                [facility] PT Assessment - 05/26/21 1154       Observation/Other Assessments   Observations limited wrist extension/ digit II metacarpal extension for CKC activities in workout, incresed upper trap overuse                           [facility] Adult PT Treatment/Exercise - 05/26/21 1152       Neuro Re-ed    Neuro Re-ed Details  cued for less upper trap overuse. more wrist/hand coactivaiton and less elbow hyperextension in CK exercises      Manual Therapy   Manual therapy comments AP mob Grade II at wrist joints / STM/MWM along forearm mm  to promote wrist extension. digit II extension at metacarpal for more stabilization in CKC exercises and minimize upper trap overuse                           PT Long Term Goals - 05/04/21 1319       PT LONG TERM GOAL #1   Title Pt will demo proper body mechanics to minimize straining of abdomen and pelvic floor with fitness routine (modifications to sit-up/ crunches) ( weight lifting)    Time 4    Period Weeks    Status Partially Met      PT LONG TERM GOAL #2   Title Pt will report no LBP occuring across across one month to continue with fitness and ADLs activities  ( 04/05/21: no more radiating pain and only soreness at times)    Time 8    Period Weeks    Status Achieved      PT LONG TERM GOAL #3   Title Pt will make modifcations to her sitting posture/ work station at work in order to minimize overactivity of pelvic floor    Time 8    Period Weeks    Status Achieved      PT LONG TERM GOAL #4  Title Pt will demo proper deep core coordination with proper diaphragmatic excursion in order to optimize postural stability and pelvic floor function    Time 10    Period Weeks    Status Achieved      PT LONG TERM GOAL #5   Title Pt will report no straining with bowel movements 100% of the time and improved boewl frequency from 3-4x/week to daily or every other day across 2 weeks in order to restore pelvic floor function    Time 6    Period Weeks    Status Achieved      Additional Long Term Goals   Additional Long Term Goals Yes      PT LONG TERM GOAL #6   Title Pt will demo decreased abdominal separation from 3 fingers width above umbilicus to < 1 fingers width and increased lateral diaphragmatic excursion in order to progress to proper pelvic floor lengthening to minimize pelvic pain and restore bowel movements and sexual function without difficulty    Time 10    Period Weeks    Status Achieved      PT LONG TERM GOAL #7   Title Pt will report decreased TMJ pain from 4/10 to < 1/10 and be IND with relaxation practices in order to increase QOL  ( 04/05/21: 3/10,  05/04/21:  3/10    Time 8     Period Weeks    Status Partially Met      PT LONG TERM GOAL #8   Title Pt will demo modifications to fitness routine with IND and proper alignment to strengthen underused mm systems to minimize risk for injuries    Time 10    Period Weeks    Status Achieved      PT LONG TERM GOAL  #9   TITLE Pt will demo no tightness of pelvic floor mm across 2 visits in order restore pelvic function    Time 10    Period Weeks    Status On-going      PT LONG TERM GOAL  #10   TITLE Pt will demo increased cervical endurance test from 2:53 to > 3:30  in order to improve head posture and decreased overuse of  upper trap/ scalenes to return to ull ups with less risk of injuries  ( 04/05/21                                             3: 55 sec)    Time 10    Period Weeks    Status Achieved    Target Date 05/17/21      PT LONG TERM GOAL  #11   TITLE Pt will demo no upper trap overuse with proper cervical retraction and deep shoulder / thoraco mm co-activation with 3 pullup ( shoulderabduction) without issues after one set across     2 days    Time 10    Period Weeks    Status New    Target Date 07/14/21                   Plan - 05/25/21 1321     Clinical Impression Statement Pt required manual Tx to R wrist and B forearms to promote more activation of hand and wrist  more stabilization in CKC exercises and minimize upper trap overuse. Pt achieved improved technique and form along  whole kinetic chain post Tx. Pt continues to benefit from skilled PT. Plan to assess pelvic floor next session.   Personal Factors and Comorbidities Fitness;Other    Stability/Clinical Decision Making Evolving/Moderate complexity    Rehab Potential Good    PT Frequency 1x / week    PT Duration Other (comment)   10   PT Treatment/Interventions Balance training;Neuromuscular re-education;Gait training;Moist Heat;Functional mobility training;Therapeutic activities;Patient/family education;Manual techniques;Therapeutic  exercise;Taping;Spinal Manipulations;Joint Manipulations;Scar mobilization;Stair training;Traction;Energy conservation    Consulted and Agree with Plan of Care Patient             Patient will benefit from skilled therapeutic intervention in order to improve the following deficits and impairments:  Increased muscle spasms, Decreased mobility, Decreased coordination, Decreased endurance, Decreased activity tolerance, Decreased range of motion, Decreased strength, Improper body mechanics, Pain, Postural dysfunction  Visit Diagnosis: Other muscle spasm  Other lack of coordination  Chronic bilateral low back pain without sciatica  Sacrococcygeal disorders, not elsewhere classified  TMJ dysfunction  Diastasis recti     Problem List There are no problems to display for this patient.   Jerl Mina, PT 05/26/2021, 11:56 AM  [place] MAIN [facility] SERVICES [address] [place], [place], [address] Phone: [phone]   Fax:  [phone]  Name: Shala Baumbach MRN: [id number] Date of Birth: 1971-03-07

## 2021-06-01 ENCOUNTER — Ambulatory Visit: Payer: BC Managed Care – PPO | Attending: Obstetrics and Gynecology | Admitting: Physical Therapy

## 2021-06-01 ENCOUNTER — Other Ambulatory Visit: Payer: Self-pay

## 2021-06-01 DIAGNOSIS — M533 Sacrococcygeal disorders, not elsewhere classified: Secondary | ICD-10-CM

## 2021-06-01 DIAGNOSIS — R278 Other lack of coordination: Secondary | ICD-10-CM | POA: Diagnosis present

## 2021-06-01 DIAGNOSIS — G8929 Other chronic pain: Secondary | ICD-10-CM

## 2021-06-01 DIAGNOSIS — M62838 Other muscle spasm: Secondary | ICD-10-CM

## 2021-06-01 DIAGNOSIS — M26609 Unspecified temporomandibular joint disorder, unspecified side: Secondary | ICD-10-CM

## 2021-06-01 DIAGNOSIS — M6208 Separation of muscle (nontraumatic), other site: Secondary | ICD-10-CM

## 2021-06-01 DIAGNOSIS — M545 Low back pain, unspecified: Secondary | ICD-10-CM | POA: Diagnosis present

## 2021-06-01 NOTE — Patient Instructions (Signed)
Stretches : (Cuing provided for proper alignment)  Instructions start with Strap on R    Stretches for your legs: LAYING on Back Use upper arms and elbows for stability when pulling strap Opposite knee bent and foot firm in align with hip   Strap on ballmound:  Hip socket  _strap, L knee bent, R ballmound against strap and spread toes, rolling foot 15 deg out and in across midline.  10 reps each side   Hamstring _knee bends  10 reps  With knee pointing straight ( slightly to outside to minimize snapping sensation)      10 reps with knee pointing out towards armpit ( notice the stretch in the medial hamstring muscle)     IT band  _scoot hips to R, cross R leg over L and straighten knee with strap on ballmound,   Bend knee back and forth 5x    Quad in sidelying _strap around the ankle, pulling ankle towards buttocks  Bottom leg firm and stabilization with knee bent  Adductors and pelvic floor ( Happy Baby) : Esmond Harps are wide towards armpits, sole of feet towards ceiling   On your back again:  Strap under R thigh Hip abductors ( figure 4)      ,L ankle over R thigh ( stretching L glut)     5  Breaths  __   Make sure to make more time for stretching and restorative yoga , pelvic breathing and pelvic floor stretches after workout and dancing.  30 min

## 2021-06-02 NOTE — Therapy (Signed)
[place] MAIN [facility] SERVICES [address] [place], [place], [address] Phone: [phone]   Fax:  [phone]  Physical Therapy Treatment  Patient Details  Name: Dorothy Owens MRN: [id number] Date of Birth: 1971/02/21 Referring Provider (PT): Benjaman Kindler   Encounter Date: 06/01/2021   PT End of Session - 06/01/21 1309     Visit Number 35    Date for PT Re-Evaluation 07/13/21   PN 11/22/20,PN 12/22/20,  PN 73/5/67, recert 0/1/41   PT Start Time 1305    PT Stop Time 1400    PT Time Calculation (min) 55 min    Activity Tolerance Patient tolerated treatment well    Behavior During Therapy [facility] for tasks assessed/performed             No past medical history on file.  No past surgical history on file.  There were no vitals filed for this visit.   Subjective Assessment - 06/01/21 1311     Subjective Pt noticed after she danced alot this weekend, she got tight in her ITband  which causes her knee and back pain. Pt stretched and foam rolled the IT band.    Pertinent History Hx of twisted ankles R and L,  fall on ice skating in 2013 on R buttock, TMJ, not perinmenopausal yet but periods are shorter    Limitations Lifting    Patient Stated Goals improving skeletal health, bone health and balance                [facility] PT Assessment - 06/02/21 0856       Palpation   Spinal mobility tightness along interspinals R in thoracic region, head alined medially without deviation to R ( indicating decreased SCM tightness), less upper trap overuse                        Pelvic Floor Special Questions - 06/02/21 0855     Pelvic Floor Internal Exam pt consented verbally and had no contraindications    Exam Type Vaginal    Palpation tightness and tenderness behind pubic symphysis B , tendency to move into posterior tilt, overuse of ab mm               [facility] Adult PT Treatment/Exercise - 06/02/21 0854        Therapeutic Activites    Other Therapeutic Activities explained the importance of implimenting more stretching routine after workout and dance to minimzie relapse of tight mm , provided review to lower kinetic chain stretches with strap,      Neuro Re-ed    Neuro Re-ed Details  cued for less posterior tilt of pelvis to minimzie pelvic lfoor tenderness./ tightness, cued for less ab mm overuse with deep core      Modalities   Modalities Moist Heat      Moist Heat Therapy   Number Minutes Moist Heat 10 Minutes   perineum during manual Tx at thoracic / shoulder     Manual Therapy   Manual therapy comments STM/MWM along thoracic mm to promote mobility and relaxation of shoulder mm    Internal Pelvic Floor STM/MWM at anterior pelvic floor mm behind pubic symphysis,                          PT Long Term Goals - 06/02/21 0901       PT LONG TERM GOAL #1   Title Pt will  demo proper body mechanics to minimize straining of abdomen and pelvic floor with fitness routine (modifications to sit-up/ crunches) ( weight lifting)    Time 4    Period Weeks    Status Partially Met      PT LONG TERM GOAL #2   Title Pt will report no LBP occuring across across one month to continue with fitness and ADLs activities  ( 04/05/21: no more radiating pain and only soreness at times)    Time 8    Period Weeks    Status Achieved      PT LONG TERM GOAL #3   Title Pt will make modifcations to her sitting posture/ work station at work in order to minimize overactivity of pelvic floor    Time 8    Period Weeks    Status Achieved      PT LONG TERM GOAL #4   Title Pt will demo proper deep core coordination with proper diaphragmatic excursion in order to optimize postural stability and pelvic floor function    Time 10    Period Weeks    Status Achieved      PT LONG TERM GOAL #5   Title Pt will report no straining with bowel movements 100% of the time and improved boewl frequency from 3-4x/week  to daily or every other day across 2 weeks in order to restore pelvic floor function    Time 6    Period Weeks    Status Achieved      PT LONG TERM GOAL #6   Title Pt will demo decreased abdominal separation from 3 fingers width above umbilicus to < 1 fingers width and increased lateral diaphragmatic excursion in order to progress to proper pelvic floor lengthening to minimize pelvic pain and restore bowel movements and sexual function without difficulty    Time 10    Period Weeks    Status Achieved      PT LONG TERM GOAL #7   Title Pt will report decreased TMJ pain from 4/10 to < 1/10 and be IND with relaxation practices in order to increase QOL  ( 04/05/21: 3/10,  05/04/21:  3/10    Time 8    Period Weeks    Status Partially Met      PT LONG TERM GOAL #8   Title Pt will demo modifications to fitness routine with IND and proper alignment to strengthen underused mm systems to minimize risk for injuries    Time 10    Period Weeks    Status Achieved      PT LONG TERM GOAL  #9   TITLE Pt will demo no tightness of pelvic floor mm across 2 visits in order restore pelvic function    Time 10    Period Weeks    Status On-going      PT LONG TERM GOAL  #10   TITLE Pt will demo increased cervical endurance test from 2:53 to > 3:30  in order to improve head posture and decreased overuse of  upper trap/ scalenes to return to ull ups with less risk of injuries  ( 04/05/21                                             3: 55 sec)    Time 10    Period Weeks    Status Achieved  Target Date 05/17/21      PT LONG TERM GOAL  #11   TITLE Pt will demo no upper trap overuse with proper cervical retraction and deep shoulder / thoraco mm co-activation with 3 pullup ( shoulderabduction) without issues after one set across     2 days    Time 10    Period Weeks    Status New    Target Date 07/14/21                   Plan - 06/01/21 1310     Clinical Impression Statement Explained the  importance of implimenting more stretching routine after workout and dance to minimzie relapse of tight mm. Provided review to lower kinetic chain stretches with strap and which muscle groups are targeted to mobilize in order to minimize knee  issues.   Pelvic floor assessment shows anterior mm tightness still remain . Pt still shows tendency to move into posterior tilt of pelvis with overuse of ab mm with deep core coordination. Plan to add self massage to abdominal area to continue helping pt to apply more relaxation to abdominal/ pelvic floor mm. Explained old holding patterns with gym exercises such as crunches/ sit ups tend to over train abdominal mm. Pt demo'd improvement of upward movement of pelvic floor post Tx.   Pt required minor manual Tx to minimize shoulder/ thoracic mm tightness . Pt is able to position cervical spine medially without deviation which signifies less tightness of SCM mm.   Pt continues to benefit from skilled PT.     Personal Factors and Comorbidities Fitness;Other    Stability/Clinical Decision Making Evolving/Moderate complexity    Rehab Potential Good    PT Frequency 1x / week    PT Duration Other (comment)   10   PT Treatment/Interventions Balance training;Neuromuscular re-education;Gait training;Moist Heat;Functional mobility training;Therapeutic activities;Patient/family education;Manual techniques;Therapeutic exercise;Taping;Spinal Manipulations;Joint Manipulations;Scar mobilization;Stair training;Traction;Energy conservation    Consulted and Agree with Plan of Care Patient             Patient will benefit from skilled therapeutic intervention in order to improve the following deficits and impairments:  Increased muscle spasms, Decreased mobility, Decreased coordination, Decreased endurance, Decreased activity tolerance, Decreased range of motion, Decreased strength, Improper body mechanics, Pain, Postural dysfunction  Visit Diagnosis: Other muscle  spasm  Other lack of coordination  Chronic bilateral low back pain without sciatica  Sacrococcygeal disorders, not elsewhere classified  TMJ dysfunction  Diastasis recti     Problem List There are no problems to display for this patient.   Jerl Mina, PT 06/02/2021, 9:01 AM  [place] MAIN [facility] SERVICES [address] [place], [place], [address] Phone: [phone]   Fax:  [phone]  Name: Smt Lokey MRN: [id number] Date of Birth: Aug 05, 1970

## 2021-06-08 ENCOUNTER — Encounter: Payer: BC Managed Care – PPO | Admitting: Physical Therapy

## 2021-06-15 ENCOUNTER — Ambulatory Visit: Payer: BC Managed Care – PPO | Admitting: Physical Therapy

## 2021-06-15 ENCOUNTER — Other Ambulatory Visit: Payer: Self-pay

## 2021-06-15 DIAGNOSIS — R278 Other lack of coordination: Secondary | ICD-10-CM

## 2021-06-15 DIAGNOSIS — M6208 Separation of muscle (nontraumatic), other site: Secondary | ICD-10-CM

## 2021-06-15 DIAGNOSIS — M545 Low back pain, unspecified: Secondary | ICD-10-CM

## 2021-06-15 DIAGNOSIS — M62838 Other muscle spasm: Secondary | ICD-10-CM

## 2021-06-15 DIAGNOSIS — M533 Sacrococcygeal disorders, not elsewhere classified: Secondary | ICD-10-CM

## 2021-06-16 NOTE — Therapy (Signed)
[place] MAIN [facility] SERVICES [address] [place], [place], [address] Phone: [phone]   Fax:  [phone]  Physical Therapy Treatment  Patient Details  Name: Dorothy Owens MRN: [id number] Date of Birth: [birth year] Referring Provider (PT): Benjaman Kindler   Encounter Date: 06/15/2021   PT End of Session - 06/16/21 1258     Visit Number 36    Date for PT Re-Evaluation 07/13/21   PN 11/22/20,PN 12/22/20,  PN 37/8/58, recert 12/03/00   PT Start Time 1305    PT Stop Time 1410    PT Time Calculation (min) 65 min    Activity Tolerance Patient tolerated treatment well    Behavior During Therapy [facility] for tasks assessed/performed             No past medical history on file.  No past surgical history on file.  There were no vitals filed for this visit.   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 Pt has not been doing her stretches. She notices she is hiking up her upper trap mm                [facility] PT Assessment - 06/16/21 1304       Observation/Other Assessments   Observations feet alignment incorrect with yoga poses, poor pelvic stability                           [facility] Adult PT Treatment/Exercise - 06/16/21 1258       Therapeutic Activites    Other Therapeutic Activities active listening, educated embodiment practices to relax low badominal. pelvic floor      Neuro Re-ed    Neuro Re-ed Details  cued for alignment of lower kinetic chain in yoga standing poses                          PT Long Term Goals - 06/02/21 0901       PT LONG TERM GOAL #1   Title Pt will demo proper body mechanics to minimize straining of abdomen and pelvic floor with fitness routine (modifications to sit-up/ crunches) ( weight lifting)    Time 4    Period Weeks    Status Partially Met      PT LONG TERM GOAL #2   Title Pt will report no  LBP occuring across across one month to continue with fitness and ADLs activities  ( 04/05/21: no more radiating pain and only soreness at times)    Time 8    Period Weeks    Status Achieved      PT LONG TERM GOAL #3   Title Pt will make modifcations to her sitting posture/ work station at work in order to minimize overactivity of pelvic floor    Time 8    Period Weeks    Status Achieved      PT LONG TERM GOAL #4   Title Pt will demo proper deep core coordination with proper diaphragmatic excursion in order to optimize postural stability and pelvic floor function    Time 10    Period Weeks    Status Achieved      PT LONG TERM GOAL #5   Title Pt will report no straining with bowel movements 100% of the time and improved boewl frequency from 3-4x/week to daily or every other  day across 2 weeks in order to restore pelvic floor function    Time 6    Period Weeks    Status Achieved      PT LONG TERM GOAL #6   Title Pt will demo decreased abdominal separation from 3 fingers width above umbilicus to < 1 fingers width and increased lateral diaphragmatic excursion in order to progress to proper pelvic floor lengthening to minimize pelvic pain and restore bowel movements and sexual function without difficulty    Time 10    Period Weeks    Status Achieved      PT LONG TERM GOAL #7   Title Pt will report decreased TMJ pain from 4/10 to < 1/10 and be IND with relaxation practices in order to increase QOL  ( 04/05/21: 3/10,  05/04/21:  3/10    Time 8    Period Weeks    Status Partially Met      PT LONG TERM GOAL #8   Title Pt will demo modifications to fitness routine with IND and proper alignment to strengthen underused mm systems to minimize risk for injuries    Time 10    Period Weeks    Status Achieved      PT LONG TERM GOAL  #9   TITLE Pt will demo no tightness of pelvic floor mm across 2 visits in order restore pelvic function    Time 10    Period Weeks    Status On-going      PT  LONG TERM GOAL  #10   TITLE Pt will demo increased cervical endurance test from 2:53 to > 3:30  in order to improve head posture and decreased overuse of  upper trap/ scalenes to return to ull ups with less risk of injuries  ( 04/05/21                                             3: 55 sec)    Time 10    Period Weeks    Status Achieved    Target Date 05/17/21      PT LONG TERM GOAL  #11   TITLE Pt will demo no upper trap overuse with proper cervical retraction and deep shoulder / thoraco mm co-activation with 3 pullup ( shoulderabduction) without issues after one set across     2 days    Time 10    Period Weeks    Status New    Target Date 07/14/21                   Plan - 06/16/21 1303     Clinical Impression Statement Pt has not been compliant with stretches. Pt has been feeling stressed. Provided active listening to understand stressors and advised pt to speak with her therapist. Motivational interviewing to co-create strategies on compliance with flexibility program in her schedule. Pt devised plan. Pt required cues for feet propioception with yoga poses. Plan to add yoga stretches pt can perform during the day for flexibility. Pt continues to benefit from skilled PT   Personal Factors and Comorbidities Fitness;Other    Stability/Clinical Decision Making Evolving/Moderate complexity    Rehab Potential Good    PT Frequency 1x / week    PT Duration Other (comment)   10   PT Treatment/Interventions Balance training;Neuromuscular re-education;Gait training;Moist Heat;Functional mobility training;Therapeutic activities;Patient/family education;Manual techniques;Therapeutic exercise;Taping;Spinal  Manipulations;Joint Manipulations;Scar mobilization;Stair training;Traction;Energy conservation    Consulted and Agree with Plan of Care Patient             Patient will benefit from skilled therapeutic intervention in order to improve the following deficits and impairments:  Increased  muscle spasms, Decreased mobility, Decreased coordination, Decreased endurance, Decreased activity tolerance, Decreased range of motion, Decreased strength, Improper body mechanics, Pain, Postural dysfunction  Visit Diagnosis: Other muscle spasm  Other lack of coordination  Chronic bilateral low back pain without sciatica  Sacrococcygeal disorders, not elsewhere classified  Diastasis recti     Problem List There are no problems to display for this patient.   Jerl Mina, PT 06/16/2021, 1:05 PM  [place] MAIN [facility] SERVICES [address] [place], [place], [address] Phone: [phone]   Fax:  [phone]  Name: Dorothy Owens MRN: [id number] Date of Birth: 1970-08-21

## 2021-06-21 ENCOUNTER — Other Ambulatory Visit: Payer: Self-pay

## 2021-06-21 ENCOUNTER — Ambulatory Visit
Admission: RE | Admit: 2021-06-21 | Discharge: 2021-06-21 | Disposition: A | Discharge status: Home / Self Care | Payer: BC Managed Care – PPO | Source: Ambulatory Visit | Attending: Obstetrics and Gynecology | Admitting: Obstetrics and Gynecology

## 2021-06-21 ENCOUNTER — Other Ambulatory Visit: Payer: Self-pay | Admitting: Obstetrics and Gynecology

## 2021-06-21 DIAGNOSIS — R921 Mammographic calcification found on diagnostic imaging of breast: Secondary | ICD-10-CM

## 2021-06-21 DIAGNOSIS — R928 Other abnormal and inconclusive findings on diagnostic imaging of breast: Secondary | ICD-10-CM | POA: Insufficient documentation

## 2021-06-22 ENCOUNTER — Ambulatory Visit: Payer: BC Managed Care – PPO | Admitting: Physical Therapy

## 2021-06-29 ENCOUNTER — Other Ambulatory Visit: Payer: Self-pay

## 2021-06-29 ENCOUNTER — Ambulatory Visit: Payer: BC Managed Care – PPO | Attending: Obstetrics and Gynecology | Admitting: Physical Therapy

## 2021-06-29 DIAGNOSIS — M6208 Separation of muscle (nontraumatic), other site: Secondary | ICD-10-CM | POA: Diagnosis present

## 2021-06-29 DIAGNOSIS — M62838 Other muscle spasm: Secondary | ICD-10-CM | POA: Insufficient documentation

## 2021-06-29 DIAGNOSIS — M545 Low back pain, unspecified: Secondary | ICD-10-CM | POA: Insufficient documentation

## 2021-06-29 DIAGNOSIS — M533 Sacrococcygeal disorders, not elsewhere classified: Secondary | ICD-10-CM | POA: Insufficient documentation

## 2021-06-29 DIAGNOSIS — G8929 Other chronic pain: Secondary | ICD-10-CM | POA: Insufficient documentation

## 2021-06-29 DIAGNOSIS — M26609 Unspecified temporomandibular joint disorder, unspecified side: Secondary | ICD-10-CM | POA: Insufficient documentation

## 2021-06-29 DIAGNOSIS — R278 Other lack of coordination: Secondary | ICD-10-CM | POA: Diagnosis present

## 2021-06-29 NOTE — Therapy (Signed)
[place] ?[place] [facility] RE[facility] SERVICES ?[place], [place], [address] ?Phone: [phone]   Fax:  [phone] ? ?Physical Therapy Treatment ? ?Patient Details  ?Name: Dorothy Owens ?MRN: [id number] ?Date of Birth: [birth year] ?Referring Provider (PT): Benjaman Kindler ? ? ?Encounter Date: 06/29/2021 ? ? PT End of Session - 06/29/21 1315   ? ? Visit Number 63   ? Date for PT Re-Evaluation 07/13/21   PN 11/22/20,PN 12/22/20,  PN 54/5/62, recert 09/03/36  ? PT Start Time 1304   ? PT Stop Time 1400   ? PT Time Calculation (min) 56 min   ? Activity Tolerance Patient tolerated treatment well   ? Behavior During Therapy [facility] for tasks assessed/performed   ? ?  ?  ? ?  ? ? ?No past medical history on file. ? ?No past surgical history on file. ? ?There were no vitals filed for this visit. ? ? Subjective Assessment - 06/29/21 1306   ? ? Subjective Pt reported she went for mammogram which she has to do every 6 months. Pt had an [place] and declined a biopsy. Pt may revisit the biopsy in 6 months. Pt has had spotting 3 weeks  after her period. Pt feels overwhelmed and she is not getting her workout and PT exercises in. Pt noticed her low back and shoulder hurt after dancing and noticed she may have let her arm been pulled by partner. Pt is not getting good sleep . Her partner snores through her CPAP machine.   ? Pertinent History Hx of twisted ankles R and L,  fall on ice skating in 2013 on R buttock, TMJ, not perinmenopausal yet but periods are shorter   ? Patient Stated Goals improving skeletal health, bone health and balance   ? ?  ?  ? ?  ? ? ? ? ? [facility] PT Assessment - 06/29/21 1416   ? ?  ? Observation/Other Assessments  ? Observations posture upright, deep core engaged   ?  ? Coordination  ? Coordination and Movement Description not overuse of obliques with deep core   ? ?  ?  ? ?  ? ? ? ? ? ? ? ? ? ? ? ? ? ? ? ? [place] Adult PT Treatment/Exercise - 06/29/21 1417   ? ?  ? Therapeutic Activites    ? Other Therapeutic Activities explained compliance to deep core and rationale with anatomy pictures and explanation of physiology. Active listening to pt's overwhelmed schedule and provided strategy to simplify her PT exercises and to fit modified versions that can be performed while she is walking at [place]   ?  ? Neuro Re-ed   ? Neuro Re-ed Details  modified band exercises that help with scapulo stabilization and can be performed while she is walking at lunch   ? ?  ?  ? ?  ? ? ? ? ? ? ? ? ? ? ? ? ? ? ? PT Long Term Goals - 06/02/21 0901   ? ?  ? PT LONG TERM GOAL #1  ? Title Pt will demo proper body mechanics to minimize straining of abdomen and pelvic floor with fitness routine (modifications to sit-up/ crunches) ( weight lifting)   ? Time 4   ? Period Weeks   ? Status Partially Met   ?  ? PT LONG TERM GOAL #2  ? Title Pt will report no LBP occuring across across one month to continue with fitness and ADLs activities  (  04/05/21: no more radiating pain and only soreness at times)   ? Time 8   ? Period Weeks   ? Status Achieved   ?  ? PT LONG TERM GOAL #3  ? Title Pt will make modifcations to her sitting posture/ work station at work in order to minimize overactivity of pelvic floor   ? Time 8   ? Period Weeks   ? Status Achieved   ?  ? PT LONG TERM GOAL #4  ? Title Pt will demo proper deep core coordination with proper diaphragmatic excursion in order to optimize postural stability and pelvic floor function   ? Time 10   ? Period Weeks   ? Status Achieved   ?  ? PT LONG TERM GOAL #5  ? Title Pt will report no straining with bowel movements 100% of the time and improved boewl frequency from 3-4x/week to daily or every other day across 2 weeks in order to restore pelvic floor function   ? Time 6   ? Period Weeks   ? Status Achieved   ?  ? PT LONG TERM GOAL #6  ? Title Pt will demo decreased abdominal separation from 3 fingers width above umbilicus to < 1 fingers width and increased lateral diaphragmatic  excursion in order to progress to proper pelvic floor lengthening to minimize pelvic pain and restore bowel movements and sexual function without difficulty   ? Time 10   ? Period Weeks   ? Status Achieved   ?  ? PT LONG TERM GOAL #7  ? Title Pt will report decreased TMJ pain from 4/10 to < 1/10 and be IND with relaxation practices in order to increase QOL  ( 04/05/21: 3/10,  05/04/21:  3/10   ? Time 8   ? Period Weeks   ? Status Partially Met   ?  ? PT LONG TERM GOAL #8  ? Title Pt will demo modifications to fitness routine with IND and proper alignment to strengthen underused mm systems to minimize risk for injuries   ? Time 10   ? Period Weeks   ? Status Achieved   ?  ? PT LONG TERM GOAL  #9  ? TITLE Pt will demo no tightness of pelvic floor mm across 2 visits in order restore pelvic function   ? Time 10   ? Period Weeks   ? Status On-going   ?  ? PT LONG TERM GOAL  #10  ? TITLE Pt will demo increased cervical endurance test from 2:53 to > 3:30  in order to improve head posture and decreased overuse of  upper trap/ scalenes to return to ull ups with less risk of injuries  ( 04/05/21                                             3: 55 sec)   ? Time 10   ? Period Weeks   ? Status Achieved   ? Target Date 05/17/21   ?  ? PT LONG TERM GOAL  #11  ? TITLE Pt will demo no upper trap overuse with proper cervical retraction and deep shoulder / thoraco mm co-activation with 3 pullup ( shoulderabduction) without issues after one set across     2 days   ? Time 10   ? Period Weeks   ? Status New   ?  Target Date 07/14/21   ? ?  ?  ? ?  ? ? ? ? ? ? ? ? Plan - 06/29/21 1418   ? ? Clinical Impression Statement Pt required active listening to help simplify her PT HEP to fit into her overwhelming schedule and to minimize relapse of Sx. Explained compliance to deep core and rationale with anatomy pictures and explanation of physiology. Active listening to pt's overwhelmed schedule and provided strategy to simplify her PT exercises and to  fit modified versions that can be performed while she is walking at [employer]. Provided cues for scapular strabilization with bands to minmize upper trap overuse.  ?Pt continues to benefit from skilled PT  ? Personal Factors and Comorbidities Fitness;Other   ? Stability/Clinical Decision Making Evolving/Moderate complexity   ? Rehab Potential Good   ? PT Frequency 1x / week   ? PT Duration Other (comment)   10  ? PT Treatment/Interventions Balance training;Neuromuscular re-education;Gait training;Moist Heat;Functional mobility training;Therapeutic activities;Patient/family education;Manual techniques;Therapeutic exercise;Taping;Spinal Manipulations;Joint Manipulations;Scar mobilization;Stair training;Traction;Energy conservation   ? Consulted and Agree with Plan of Care Patient   ? ?  ?  ? ?  ? ? ?Patient will benefit from skilled therapeutic intervention in order to improve the following deficits and impairments:  Increased muscle spasms, Decreased mobility, Decreased coordination, Decreased endurance, Decreased activity tolerance, Decreased range of motion, Decreased strength, Improper body mechanics, Pain, Postural dysfunction ? ?Visit Diagnosis: ?Other muscle spasm ? ?Other lack of coordination ? ?Chronic bilateral low back pain without sciatica ? ?Sacrococcygeal disorders, not elsewhere classified ? ? ? ? ?Problem List ?There are no problems to display for this patient. ? ? ?Jerl Mina, PT ?06/29/2021, 2:20 PM ? ?[place] ?[place] [facility] ?[place], [place], [address] ?Phone: [phone]   Fax:  [phone] ? ?Name: Dorothy Owens ?MRN: [id number] ?Date of Birth: 02/01/1971 ? ? ? ?

## 2021-07-08 ENCOUNTER — Other Ambulatory Visit: Payer: Self-pay | Admitting: Obstetrics and Gynecology

## 2021-07-08 DIAGNOSIS — R921 Mammographic calcification found on diagnostic imaging of breast: Secondary | ICD-10-CM

## 2021-07-13 ENCOUNTER — Other Ambulatory Visit: Payer: Self-pay

## 2021-07-13 ENCOUNTER — Ambulatory Visit: Payer: BC Managed Care – PPO | Admitting: Physical Therapy

## 2021-07-13 DIAGNOSIS — M545 Low back pain, unspecified: Secondary | ICD-10-CM

## 2021-07-13 DIAGNOSIS — M26609 Unspecified temporomandibular joint disorder, unspecified side: Secondary | ICD-10-CM

## 2021-07-13 DIAGNOSIS — R278 Other lack of coordination: Secondary | ICD-10-CM

## 2021-07-13 DIAGNOSIS — M62838 Other muscle spasm: Secondary | ICD-10-CM | POA: Diagnosis not present

## 2021-07-13 DIAGNOSIS — M533 Sacrococcygeal disorders, not elsewhere classified: Secondary | ICD-10-CM

## 2021-07-13 DIAGNOSIS — M6208 Separation of muscle (nontraumatic), other site: Secondary | ICD-10-CM

## 2021-07-13 NOTE — Patient Instructions (Signed)
To counter act R anterior ribs from dance ? ? ?-bear stretch by door way  ?-scoot hips to R, drop knees to L pillow under bottom knee , and between knee  , head turn to L  ?-open book to L  ? ?__ ? ? Use of resistance band with dance moves to strengthening R shoulder  ? ?__ ? ?Utilize push hands exercise with a partner and practice grounding into legs not in upper body  ?

## 2021-07-13 NOTE — Therapy (Signed)
[place] ?[place] ?[place], [place], [address] ?Phone: [phone]   Fax:  [phone] ? ?Physical Therapy Treatment ? ?Patient Details  ?Name: Dorothy Owens ?MRN: [id number] ?Date of Birth: 01/19/1971 ?No data recorded ? ?Encounter Date: 07/13/2021 ? ? PT End of Session - 07/13/21 1407   ? ? Visit Number 38   ? Date for PT Re-Evaluation 09/21/21   PN 11/22/20,PN 12/22/20,  PN 69/4/85, recert 08/04/25  ? PT Start Time 0350   ? PT Stop Time 1407   ? PT Time Calculation (min) 62 min   ? Activity Tolerance Patient tolerated treatment well   ? Behavior During Therapy [facility] for tasks assessed/performed   ? ?  ?  ? ?  ? ? ?No past medical history on file. ? ?No past surgical history on file. ? ?There were no vitals filed for this visit. ? ? Subjective Assessment - 07/13/21 1314   ? ? Subjective Pt liked the walking with the red band exercises for the arms. Pt noticed she injuried her R shoulder from dancing. Pt stopped doing pull ups   ? Pertinent History Hx of twisted ankles R and L,  fall on ice skating in 2013 on R buttock, TMJ, not perinmenopausal yet but periods are shorter   ? Patient Stated Goals improving skeletal health, bone health and balance   ? ?  ?  ? ?  ? ? ? ? ? [facility] PT Assessment - 07/13/21 1414   ? ?  ? Observation/Other Assessments  ? Observations simulated dance movements showed R trunk anterior rotation   ?  ? Palpation  ? Spinal mobility tightness along paraspinals L > R, levator scapula/ medial scapular mm B   ? Palpation comment no upper trap tightness   ? ?  ?  ? ?  ? ? ? ? ? ? ? ? ? ? ? ? ? ? ? ? [facility] Adult PT Treatment/Exercise - 07/13/21 1416   ? ?  ? Therapeutic Activites   ? Other Therapeutic Activities active listening to pt's modifications with fitness trainer and avoiding pull ups for now, returning back to teaching dance, explained ways to stetch to compliment dancing and minimize R trunk rotation / tighter L back mm   ?  ? Neuro  Re-ed   ? Neuro Re-ed Details  cued for L trunk stretches to compliment dancing  ?Cued for WBing BLE ( push hands exercise)   ?  ? Moist Heat Therapy  ? Number Minutes Moist Heat 5 Minutes   ? Moist Heat Location --   cervicothoracic (unbilled)  ? ?  ?  ? ?  ? ? ? ? ? ? ? ? ? ? ? ? ? ? ? PT Long Term Goals - 07/13/21 1409   ? ?  ? PT LONG TERM GOAL #1  ? Title Pt will demo proper body mechanics to minimize straining of abdomen and pelvic floor with fitness routine (modifications to sit-up/ crunches) ( weight lifting)   ? Time 4   ? Period Weeks   ? Status Partially Met   ?  ? PT LONG TERM GOAL #2  ? Title Pt will report no LBP occuring across across one month to continue with fitness and ADLs activities  ( 04/05/21: no more radiating pain and only soreness at times)   ? Time 8   ? Period Weeks   ? Status Achieved   ?  ? PT LONG TERM  GOAL #3  ? Title Pt will make modifcations to her sitting posture/ work station at work in order to minimize overactivity of pelvic floor   ? Time 8   ? Period Weeks   ? Status Achieved   ?  ? PT LONG TERM GOAL #4  ? Title Pt will demo proper deep core coordination with proper diaphragmatic excursion in order to optimize postural stability and pelvic floor function   ? Time 10   ? Period Weeks   ? Status Achieved   ?  ? PT LONG TERM GOAL #5  ? Title Pt will report no straining with bowel movements 100% of the time and improved boewl frequency from 3-4x/week to daily or every other day across 2 weeks in order to restore pelvic floor function   ? Time 6   ? Period Weeks   ? Status Achieved   ?  ? PT LONG TERM GOAL #6  ? Title Pt will demo decreased abdominal separation from 3 fingers width above umbilicus to < 1 fingers width and increased lateral diaphragmatic excursion in order to progress to proper pelvic floor lengthening to minimize pelvic pain and restore bowel movements and sexual function without difficulty   ? Time 10   ? Period Weeks   ? Status Achieved   ?  ? PT LONG TERM GOAL #7   ? Title Pt will report decreased TMJ pain from 4/10 to < 1/10 and be IND with relaxation practices in order to increase QOL  ( 04/05/21: 3/10,  05/04/21:  3/10   ? Time 8   ? Period Weeks   ? Status Partially Met   ?  ? PT LONG TERM GOAL #8  ? Title Pt will demo modifications to fitness routine with IND and proper alignment to strengthen underused mm systems to minimize risk for injuries   ? Time 10   ? Period Weeks   ? Status Partially Met   ?  ? PT LONG TERM GOAL  #9  ? TITLE Pt will demo no tightness of pelvic floor mm across 2 visits in order restore pelvic function   ? Time 10   ? Period Weeks   ? Status On-going   ?  ? PT LONG TERM GOAL  #10  ? TITLE Pt will demo increased cervical endurance test from 2:53 to > 3:30  in order to improve head posture and decreased overuse of  upper trap/ scalenes to return to ull ups with less risk of injuries  ( 04/05/21                                             3: 55 sec)   ? Time 10   ? Period Weeks   ? Status Achieved   ? Target Date 05/17/21   ?  ? PT LONG TERM GOAL  #11  ? TITLE Pt will demo no upper trap overuse with proper cervical retraction and deep shoulder / thoraco mm co-activation with 3 pullup ( shoulderabduction) without issues after one set across     2 days   ? Time 10   ? Period Weeks   ? Status Deferred   ? Target Date 07/14/21   ?  ? PT LONG TERM GOAL  #12  ? TITLE Pt will demo no increased L paraspinal mm tightness and be compliant with complimentary  stretches for L rotation to balance out dancing which involces more R thoracic rotation in order to enjoy dancing with less relapse of spinal asymmetries   ? Time 10   ? Period Weeks   ? Status New   ? Target Date 09/21/21   ? ?  ?  ? ?  ? ? ? ? ? ? ? ? Plan - 07/13/21 1409   ? ? Clinical Impression Statement Pt has achieved 6/12 goals and progressing well towards remaining goals. Pt is more IND with her workout and demonstrates understanding on how to modify exercises prescribed by trainer to minimize relapse  of Sx.  ? ? Frequency of sessions down to 2 x month as pt demonstrates more IND with self-care and return to her dancing and fitness workouts in modified ways.  ?However, pt still benefits from skilled PT to help minimize return of asymmetries of her spine.  ? 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 Pt was provided stretches to compliment this hobby and minimize relapse of asymmetries of her spine. Pt continues to benefit from skilled PT. Plan to assess pelvic floor at next session.  ? Personal Factors and Comorbidities Fitness;Other   ? Stability/Clinical Decision Making Evolving/Moderate complexity   ? Rehab Potential Good   ? PT Frequency Biweekly   ? PT Duration Other (comment)   10  ? PT Treatment/Interventions Balance training;Neuromuscular re-education;Gait training;Moist Heat;Functional mobility training;Therapeutic activities;Patient/family education;Manual techniques;Therapeutic exercise;Taping;Spinal Manipulations;Joint Manipulations;Scar mobilization;Stair training;Traction;Energy conservation   ? Consulted and Agree with Plan of Care Patient   ? ?  ?  ? ?  ? ? ?Patient will benefit from skilled therapeutic intervention in order to improve the following deficits and impairments:  Increased muscle spasms, Decreased mobility, Decreased coordination, Decreased endurance, Decreased activity tolerance, Decreased range of motion, Decreased strength, Improper body mechanics, Pain, Postural dysfunction ? ?Visit Diagnosis: ?Other muscle spasm ? ?Other lack of coordination ? ?Chronic bilateral low back pain without sciatica ? ?Sacrococcygeal disorders, not elsewhere classified ? ?Diastasis recti ? ?TMJ dysfunction ? ? ? ? ?Problem List ?There are no problems to display for this patient. ? ? ?Jerl Mina, PT ?07/13/2021, 2:19 PM ? ?[place] [facility] [facility] ?[place], [place], [address] ?Phone: [phone]   Fax:  [phone] ? ?Name: Dametra Whetsel ?MRN: [id number] ?Date of Birth: 11/10/1970 ? ? ? ?

## 2021-08-03 ENCOUNTER — Encounter: Payer: BC Managed Care – PPO | Admitting: Physical Therapy

## 2021-08-17 ENCOUNTER — Ambulatory Visit: Payer: BC Managed Care – PPO | Admitting: Physical Therapy

## 2021-08-22 ENCOUNTER — Ambulatory Visit: Payer: BC Managed Care – PPO | Attending: Obstetrics and Gynecology | Admitting: Physical Therapy

## 2021-08-22 DIAGNOSIS — M26609 Unspecified temporomandibular joint disorder, unspecified side: Secondary | ICD-10-CM | POA: Insufficient documentation

## 2021-08-22 DIAGNOSIS — M6208 Separation of muscle (nontraumatic), other site: Secondary | ICD-10-CM | POA: Diagnosis present

## 2021-08-22 DIAGNOSIS — M545 Low back pain, unspecified: Secondary | ICD-10-CM | POA: Diagnosis present

## 2021-08-22 DIAGNOSIS — G8929 Other chronic pain: Secondary | ICD-10-CM | POA: Diagnosis present

## 2021-08-22 DIAGNOSIS — M533 Sacrococcygeal disorders, not elsewhere classified: Secondary | ICD-10-CM | POA: Diagnosis present

## 2021-08-22 DIAGNOSIS — M62838 Other muscle spasm: Secondary | ICD-10-CM | POA: Insufficient documentation

## 2021-08-22 DIAGNOSIS — R278 Other lack of coordination: Secondary | ICD-10-CM | POA: Diagnosis present

## 2021-08-22 NOTE — Therapy (Signed)
[place] [place] ?[facility] ?[place], [place], [address] ?Phone: [phone]   Fax:  [phone] ? ?Physical Therapy Treatment ? ?Patient Details  ?Name: Dorothy Owens ?MRN: [id number] ?Date of Birth: [birth year] ?No data recorded ? ?Encounter Date: 08/22/2021 ? ? PT End of Session - 08/22/21 1314   ? ? Visit Number 39   ? Date for PT Re-Evaluation 09/21/21   PN 11/22/20,PN 12/22/20,  PN 11/05/65, recert 09/02/47  ? PT Start Time 2010   ? PT Stop Time 1400   ? PT Time Calculation (min) 53 min   ? Activity Tolerance Patient tolerated treatment well   ? Behavior During Therapy [facility] for tasks assessed/performed   ? ?  ?  ? ?  ? ? ?No past medical history on file. ? ?No past surgical history on file. ? ?There were no vitals filed for this visit. ? ? Subjective Assessment - 08/22/21 1313   ? ? Subjective Pt is doing R trunk turn sto counteract the rotation from swing dancing with a partner. Pt would like to get her shoulder , jaw, and pelvic floor assessed today. It still feels like something is pulling on R.   ? Pertinent History Hx of twisted ankles R and L,  fall on ice skating in 2013 on R buttock, TMJ, not perinmenopausal yet but periods are shorter   ? Patient Stated Goals improving skeletal health, bone health and balance   ? ?  ?  ? ?  ? ? ? ? ? [facility] PT Assessment - 08/22/21 1706   ? ?  ? Strength  ? Overall Strength Comments B hip abduction 3/5   ?  ? Palpation  ? Spinal mobility tightness along L thoracic paraspinals, levator scapula   ? Palpation comment less jaw tightness B , no deviation of cervical/thoracic segments   ? ?  ?  ? ?  ? ? ? ? ? ? ? ? ? ? ? ? ? ? ? ? [facility] PT Treatment/Exercise - 08/22/21 1418   ? ?  ? Neuro Re-ed   ? Neuro Re-ed Details  cued for clam shells , deep core, neck ROM   ?  ? Manual Therapy  ? Manual therapy comments STM/MWM along paraspinals L thoracic, levator L   ? ?  ?  ? ?  ? ? ? ? ? ? ? ? ? ? ? ? ? ? ? PT Long Term Goals -  07/13/21 1409   ? ?  ? PT LONG TERM GOAL #1  ? Title Pt will demo proper body mechanics to minimize straining of abdomen and pelvic floor with fitness routine (modifications to sit-up/ crunches) ( weight lifting)   ? Time 4   ? Period Weeks   ? Status Partially Met   ?  ? PT LONG TERM GOAL #2  ? Title Pt will report no LBP occuring across across one month to continue with fitness and ADLs activities  ( 04/05/21: no more radiating pain and only soreness at times)   ? Time 8   ? Period Weeks   ? Status Achieved   ?  ? PT LONG TERM GOAL #3  ? Title Pt will make modifcations to her sitting posture/ work station at work in order to minimize overactivity of pelvic floor   ? Time 8   ? Period Weeks   ? Status Achieved   ?  ? PT LONG TERM GOAL #4  ? Title  Pt will demo proper deep core coordination with proper diaphragmatic excursion in order to optimize postural stability and pelvic floor function   ? Time 10   ? Period Weeks   ? Status Achieved   ?  ? PT LONG TERM GOAL #5  ? Title Pt will report no straining with bowel movements 100% of the time and improved boewl frequency from 3-4x/week to daily or every other day across 2 weeks in order to restore pelvic floor function   ? Time 6   ? Period Weeks   ? Status Achieved   ?  ? PT LONG TERM GOAL #6  ? Title Pt will demo decreased abdominal separation from 3 fingers width above umbilicus to < 1 fingers width and increased lateral diaphragmatic excursion in order to progress to proper pelvic floor lengthening to minimize pelvic pain and restore bowel movements and sexual function without difficulty   ? Time 10   ? Period Weeks   ? Status Achieved   ?  ? PT LONG TERM GOAL #7  ? Title Pt will report decreased TMJ pain from 4/10 to < 1/10 and be IND with relaxation practices in order to increase QOL  ( 04/05/21: 3/10,  05/04/21:  3/10   ? Time 8   ? Period Weeks   ? Status Partially Met   ?  ? PT LONG TERM GOAL #8  ? Title Pt will demo modifications to fitness routine with IND and  proper alignment to strengthen underused mm systems to minimize risk for injuries   ? Time 10   ? Period Weeks   ? Status Partially Met   ?  ? PT LONG TERM GOAL  #9  ? TITLE Pt will demo no tightness of pelvic floor mm across 2 visits in order restore pelvic function   ? Time 10   ? Period Weeks   ? Status On-going   ?  ? PT LONG TERM GOAL  #10  ? TITLE Pt will demo increased cervical endurance test from 2:53 to > 3:30  in order to improve head posture and decreased overuse of  upper trap/ scalenes to return to ull ups with less risk of injuries  ( 04/05/21                                             3: 55 sec)   ? Time 10   ? Period Weeks   ? Status Achieved   ? Target Date 05/17/21   ?  ? PT LONG TERM GOAL  #11  ? TITLE Pt will demo no upper trap overuse with proper cervical retraction and deep shoulder / thoraco mm co-activation with 3 pullup ( shoulderabduction) without issues after one set across     2 days   ? Time 10   ? Period Weeks   ? Status Deferred   ? Target Date 07/14/21   ?  ? PT LONG TERM GOAL  #12  ? TITLE Pt will demo no increased L paraspinal mm tightness and be compliant with complimentary stretches for L rotation to balance out dancing which involces more R thoracic rotation in order to enjoy dancing with less relapse of spinal asymmetries   ? Time 10   ? Period Weeks   ? Status New   ? Target Date 09/21/21   ? ?  ?  ? ?  ? ? ? ? ? ? ? ?  Plan - 08/22/21 1314   ? ? Clinical Impression Statement Pt demo'd good carry over with no more deviations to cervical and thoracic segments but showed increased tightness at L thoracic paraspinal mm. Pt required cues for past HEP as pt has not been compliant with past HEP. Provided a chart with simplified routine of essential exercises to maintain and was explained the function of these exercises. Pt continues to benefit from skilled PT.   ? Personal Factors and Comorbidities Fitness;Other   ? Stability/Clinical Decision Making Evolving/Moderate complexity   ?  Rehab Potential Good   ? PT Frequency Biweekly   ? PT Duration Other (comment)   10  ? PT Treatment/Interventions Balance training;Neuromuscular re-education;Gait training;Moist Heat;Functional mobility training;Therapeutic activities;Patient/family education;Manual techniques;Therapeutic exercise;Taping;Spinal Manipulations;Joint Manipulations;Scar mobilization;Stair training;Traction;Energy conservation   ? Consulted and Agree with Plan of Care Patient   ? ?  ?  ? ?  ? ? ?Patient will benefit from skilled therapeutic intervention in order to improve the following deficits and impairments:  Increased muscle spasms, Decreased mobility, Decreased coordination, Decreased endurance, Decreased activity tolerance, Decreased range of motion, Decreased strength, Improper body mechanics, Pain, Postural dysfunction ? ?Visit Diagnosis: ?Other muscle spasm ? ?Sacrococcygeal disorders, not elsewhere classified ? ?Other lack of coordination ? ?Chronic bilateral low back pain without sciatica ? ?Diastasis recti ? ?TMJ dysfunction ? ? ? ? ?Problem List ?There are no problems to display for this patient. ? ? ?Jerl Mina, PT ?08/22/2021, 5:35 PM ? ?[place] [facility] ?[place], [place], [address] ?Phone: [phone]   Fax:  [phone] ? ?Name: Dorothy Owens ?MRN: [id number] ?Date of Birth: 1971/01/02 ? ? ? ?

## 2021-08-22 NOTE — Patient Instructions (Addendum)
?  Neck/Midback mobility ? Mon Tue Wed Thurs Fri  Sat Wynelle Link  ?Neck - 6 directions ? ?Angel wings  ?        ?Open book  ?        ?  ?        ? ?pelvic stability ? Mon Tue Wed Thurs Fri  Sat Wynelle Link  ?Clamshells ? ?20 reps  ?        ?Teeter / totter with band  ?        ?  ?        ? ?Deep core  ? Mon Tue Wed Thurs Fri  Sat Wynelle Link  ?Level 1  ?(breathing 10 reps)   ?        ?Level 2  ?6 min   ?        ?  ?        ? ? ? ? ? ? ? ?

## 2021-09-14 ENCOUNTER — Ambulatory Visit: Payer: BC Managed Care – PPO | Attending: Obstetrics and Gynecology | Admitting: Physical Therapy

## 2021-09-14 DIAGNOSIS — M6208 Separation of muscle (nontraumatic), other site: Secondary | ICD-10-CM | POA: Diagnosis present

## 2021-09-14 DIAGNOSIS — M62838 Other muscle spasm: Secondary | ICD-10-CM | POA: Diagnosis present

## 2021-09-14 DIAGNOSIS — M533 Sacrococcygeal disorders, not elsewhere classified: Secondary | ICD-10-CM

## 2021-09-14 DIAGNOSIS — G8929 Other chronic pain: Secondary | ICD-10-CM | POA: Diagnosis present

## 2021-09-14 DIAGNOSIS — M545 Low back pain, unspecified: Secondary | ICD-10-CM | POA: Diagnosis present

## 2021-09-14 DIAGNOSIS — R278 Other lack of coordination: Secondary | ICD-10-CM | POA: Diagnosis present

## 2021-09-14 DIAGNOSIS — M26609 Unspecified temporomandibular joint disorder, unspecified side: Secondary | ICD-10-CM | POA: Diagnosis present

## 2021-09-14 NOTE — Patient Instructions (Signed)
Moisturizers ?             They are used in the vagina to hydrate the mucous membrane that make up the vaginal canal. ?             Designed to keep a more normal acid balance (ph) ?             Once placed in the vagina, it will last between two to three days. ?             Use 2-3 times per week at bedtime and last longer than 60 min. ?             Ingredients to avoid is glycerin and fragrance, can increase chance of infection ?             Should not be used just before sex due to causing irritation ?             Most are gels administered either in a tampon-shaped applicator or as a vaginal suppository.  ?              They  are non-hormonal. ?  ?  ?Types of Moisturizers ?             Leatrice Jewels- drug store ?             Vitamin E vaginal suppositories- Whole foods, Amazon ?             Moist Again ?             Coconut oil- can break down condoms ?             Julva- (Do no use if on Tamoxifen) amazon ?             Yes moisturizer- amazon ?             NeuEve Silk , NeuEve Silver for menopausal or over 65 (if have severe vaginal atrophy or cancer  ?              treatments use NeuEve Silk for  1 month than move to [employer])- [employer], [email or web address] ?             Olive and Bee intimate cream- [email or web address] ?             Mae vaginal moisturizer- Amazon ?   ?  ?Lubrication ?             Used for intercourse to reduce friction ?             Avoid ones that have glycerin, warming gels, tingling gels, icing or cooling gel, scented ?             Avoid parabens due to a preservative similar to female sex hormone ?             May need to be reapplied once or several times during sexual activity ?             Can be applied to both partners genitals prior to vaginal penetration to minimize friction or irritation ?             Prevent irritation and mucosal tears that cause post coital pain and increased the risk of vaginal and ?               urinary tract infections ?  Oil-based lubricants cannot be  used with condoms due to breaking them down.  Least likely to irritate ?               vaginal tissue. ?             Plant based-lubes are safe ?             Silicone-based lubrication are thicker and last long and used for post-menopausal women ?  ?Vaginal Lubricators ?Here is a list of some suggested lubricators you can use for intercourse. Use the most hypoallergenic product.  You can place on you or your partner. ?.     Slippery Stuff ?    Sylk or Sliquid Natural H2O ( good  if frequent UTI?s) ?               Blossom Organics ([email or web address]-[email or web address]) ?               Luvena ?               Coconut oil ?               PJur Woman Nude- water based lubricant, amazon ?               Rubin Payor- Amazon ?               Aloe Vera ?               Yes lubricant- Amazon ?               Wet Platinum-Silicone, Target, [facility] ?               Olive and Bee intimate cream-  [email or web address].[email or web address] ? ?Things to avoid in lubricants are glycerin, warming gels, tingling gels, icing or cooling ?gels, and scented gels.  Also avoid Vaseline. ?KY jelly, Replens, and Astroglide kills good bacteria(lactobacilli) ?  ?Things to avoid in the vaginal area ?             Do not use things to irritate the vulvar area ?             No lotions- see below ?             No soaps; can use Aveeno or Calendula cleanser if needed. Must be gentle ?             No deodorants ?             No douches ?             Good to sleep without underwear to let the vaginal area to air out ?             No scrubbing: spread the lips to let warm water rinse over labias and pat dry ?  ? ?

## 2021-09-14 NOTE — Therapy (Signed)
[place] ?[facility] ?[place], [place], [address] ?Phone: [phone]   Fax:  [phone] ? ?Physical Therapy Treatment  / Progress Note across 10 visits from 04/05/22 to 09/14/21  ? ?Patient Details  ?Name: Dorothy Owens ?MRN: [id number] ?Date of Birth: 05/28/1970 ?No data recorded ? ?Encounter Date: 09/14/2021 ? ? PT End of Session - 09/14/21 1318   ? ? Visit Number 40   ? Date for PT Re-Evaluation 11/23/21   PN 11/22/20,PN 12/22/20,  PN 10/01/35, recert 09/30/81  ? PT Start Time 1303   ? PT Stop Time 1410   ? PT Time Calculation (min) 67 min   ? Activity Tolerance Patient tolerated treatment well   ? Behavior During Therapy [facility] for tasks assessed/performed   ? ?  ?  ? ?  ? ? ?No past medical history on file. ? ?No past surgical history on file. ? ?There were no vitals filed for this visit. ? ? Subjective Assessment - 09/14/21 1318   ? ? Subjective Pt has been caring for her mom who is in the hospital and she has not kept up with her routine. Pt notices her posture is slouchy and she has been sleeping in the hospital chair. Pt has coughing spell last month and she felt she pulled a muscle along her sternum.   ? Pertinent History Hx of twisted ankles R and L,  fall on ice skating in 2013 on R buttock, TMJ, not perinmenopausal yet but periods are shorter   ? Patient Stated Goals improving skeletal health, bone health and balance   ? ?  ?  ? ?  ? ? ? ? ? [facility] PT Assessment - 09/14/21 1411   ? ?  ? Palpation  ? Spinal mobility tightness along R interspinals, rhomboids, intercostals T7-10, T3-7 hypomobile L   ? ?  ?  ? ?  ? ? ? ? ? ? ? ? ? ? ? ? ? ? ? ? [facility] Adult PT Treatment/Exercise - 09/14/21 1412   ? ?  ? Therapeutic Activites   ? Other Therapeutic Activities active listening to pt's caring for her mother in the hospital, explained the importance to return to deep core 1-2 and multidifis to minimize return to dominant tensions of paraspinals./ interspinals at  thoracic area   ?  ? Modalities  ? Modalities Moist Heat   ?  ? Moist Heat Therapy  ? Number Minutes Moist Heat 10 Minutes   no billed  ? Moist Heat Location Lumbar Spine;Cervical   thoracic areas,  legs propped and elevated  ?  ? Manual Therapy  ? Manual therapy comments STM/MWM along problem areas noted in assessment to promote more mobility   ? ?  ?  ? ?  ? ? ? ? ? ? ? ? ? ? ? ? ? ? ? PT Long Term Goals - 09/14/21 1320   ? ?  ? PT LONG TERM GOAL #1  ? Title Pt will demo proper body mechanics to minimize straining of abdomen and pelvic floor with fitness routine (modifications to sit-up/ crunches) ( weight lifting)   ? Time 4   ? Period Weeks   ? Status Achieved   ?  ? PT LONG TERM GOAL #2  ? Title Pt will report no LBP occuring across across one month to continue with fitness and ADLs activities  ( 04/05/21: no more radiating pain and only soreness at times)   ? Time 8   ?  Period Weeks   ? Status Achieved   ?  ? PT LONG TERM GOAL #3  ? Title Pt will make modifcations to her sitting posture/ work station at work in order to minimize overactivity of pelvic floor   ? Time 8   ? Period Weeks   ? Status Achieved   ?  ? PT LONG TERM GOAL #4  ? Title Pt will demo proper deep core coordination with proper diaphragmatic excursion in order to optimize postural stability and pelvic floor function   ? Time 10   ? Period Weeks   ? Status Achieved   ?  ? PT LONG TERM GOAL #5  ? Title Pt will report no straining with bowel movements 100% of the time and improved boewl frequency from 3-4x/week to daily or every other day across 2 weeks in order to restore pelvic floor function   ? Time 6   ? Period Weeks   ? Status Achieved   ?  ? PT LONG TERM GOAL #6  ? Title Pt will demo decreased abdominal separation from 3 fingers width above umbilicus to < 1 fingers width and increased lateral diaphragmatic excursion in order to progress to proper pelvic floor lengthening to minimize pelvic pain and restore bowel movements and sexual  function without difficulty   ? Time 10   ? Period Weeks   ? Status Achieved   ?  ? PT LONG TERM GOAL #7  ? Title Pt will report decreased TMJ pain from 4/10 to < 1/10 and be IND with relaxation practices in order to increase QOL  ( 04/05/21: 3/10,  05/04/21:  3/10 , 09/14/21: 3/10)   ? Time 8   ? Period Weeks   ? Status Partially Met   ? Target Date 11/09/21   ?  ? PT LONG TERM GOAL #8  ? Title Pt will demo modifications to fitness routine with IND and proper alignment to strengthen underused mm systems to minimize risk for injuries   ? Time 10   ? Period Weeks   ? Status Achieved   ?  ? PT LONG TERM GOAL  #9  ? TITLE Pt will demo no tightness of pelvic floor mm across 2 visits in order restore pelvic function   ? Time 10   ? Period Weeks   ? Status Partially Met   ? Target Date 11/23/21   ?  ? PT LONG TERM GOAL  #10  ? TITLE Pt will demo increased cervical endurance test from 2:53 to > 3:30  in order to improve head posture and decreased overuse of  upper trap/ scalenes to return to ull ups with less risk of injuries  ( 04/05/21                                             3: 55 sec)   ? Time 10   ? Period Weeks   ? Status Achieved   ? Target Date 05/17/21   ?  ? PT LONG TERM GOAL  #11  ? TITLE Pt will demo no upper trap overuse with proper cervical retraction and deep shoulder / thoraco mm co-activation with 3 pullup ( shoulderabduction) without issues after one set across     2 days   ? Time 10   ? Period Weeks   ? Status Deferred   ? Target  Date 07/14/21   ?  ? PT LONG TERM GOAL  #12  ? TITLE Pt will demo no increased L paraspinal mm tightness and be compliant with complimentary stretches for L rotation to balance out dancing which involces more R thoracic rotation in order to enjoy dancing with less relapse of spinal asymmetries   ? Time 8   ? Period Weeks   ? Status New   ? Target Date 11/09/21   ? ?  ?  ? ?  ? ? ? ? ? ? ? ? Plan - 09/14/21 1414   ? ? Clinical Impression Statement Pt has achieved 8/12 goals and  progressing well towards remaining goals. Pt is maintaining aligned posture as she has returned to her gym and fitness routine. Pt has been able to experience sexual intercourse with less pain but still demonstrates mm tightness upon assessment in the past visits, indicating pt still requires more relaxation practices and pelvic floor stretches. Plan to educate pt on dilator use if she is interested.  ? ?Pt has not been compliant to HEP due to caring for mother who is in the hospital. Pt's tightness today were in the R upper quadrant and paraspinal/ interspinal along thoracic region were tight. Pt demo'd improved mobility post Tx. Pt was educated on return to deep core and multidifis strengthening and open book to minimize asymmetrical tensions. Pt continues to benefit from skilled PT.   ? Personal Factors and Comorbidities Fitness;Other   ? Stability/Clinical Decision Making Evolving/Moderate complexity   ? Rehab Potential Good   ? PT Frequency Biweekly   ? PT Duration Other (comment)   10  ? PT Treatment/Interventions Balance training;Neuromuscular re-education;Gait training;Moist Heat;Functional mobility training;Therapeutic activities;Patient/family education;Manual techniques;Therapeutic exercise;Taping;Spinal Manipulations;Joint Manipulations;Scar mobilization;Stair training;Traction;Energy conservation   ? Consulted and Agree with Plan of Care Patient   ? ?  ?  ? ?  ? ? ?Patient will benefit from skilled therapeutic intervention in order to improve the following deficits and impairments:  Increased muscle spasms, Decreased mobility, Decreased coordination, Decreased endurance, Decreased activity tolerance, Decreased range of motion, Decreased strength, Improper body mechanics, Pain, Postural dysfunction ? ?Visit Diagnosis: ?Sacrococcygeal disorders, not elsewhere classified ? ?Other muscle spasm ? ?Chronic bilateral low back pain without sciatica ? ?Other lack of coordination ? ?Diastasis recti ? ?TMJ  dysfunction ? ? ? ? ?Problem List ?There are no problems to display for this patient. ? ? ?Jerl Mina, PT ?09/14/2021, 2:16 PM ? ?[place] [facility] ?[place]

## 2021-10-12 ENCOUNTER — Encounter: Payer: BC Managed Care – PPO | Admitting: Physical Therapy

## 2021-10-19 ENCOUNTER — Encounter: Payer: BC Managed Care – PPO | Admitting: Physical Therapy

## 2021-11-16 ENCOUNTER — Ambulatory Visit: Payer: BC Managed Care – PPO | Attending: Obstetrics and Gynecology | Admitting: Physical Therapy

## 2021-11-16 DIAGNOSIS — M6208 Separation of muscle (nontraumatic), other site: Secondary | ICD-10-CM | POA: Diagnosis present

## 2021-11-16 DIAGNOSIS — G8929 Other chronic pain: Secondary | ICD-10-CM | POA: Diagnosis present

## 2021-11-16 DIAGNOSIS — M26609 Unspecified temporomandibular joint disorder, unspecified side: Secondary | ICD-10-CM | POA: Insufficient documentation

## 2021-11-16 DIAGNOSIS — M62838 Other muscle spasm: Secondary | ICD-10-CM | POA: Diagnosis present

## 2021-11-16 DIAGNOSIS — M545 Low back pain, unspecified: Secondary | ICD-10-CM | POA: Insufficient documentation

## 2021-11-16 DIAGNOSIS — M533 Sacrococcygeal disorders, not elsewhere classified: Secondary | ICD-10-CM | POA: Insufficient documentation

## 2021-11-16 DIAGNOSIS — R278 Other lack of coordination: Secondary | ICD-10-CM | POA: Insufficient documentation

## 2021-11-16 NOTE — Therapy (Signed)
OUTPATIENT PHYSICAL THERAPY TREATMENT NOTE    Patient Name: Dorothy Owens MRN: [id number] DOB:03-28-1971, 51 y.o., female Today's Date: 11/16/2021   REFERRING PROVIDER: Leafy Ro MD    PT End of Session - 11/16/21 1336     Visit Number 41    Date for PT Re-Evaluation 11/23/21    PT Start Time 1334    PT Stop Time 1415    PT Time Calculation (min) 41 min    Activity Tolerance Patient tolerated treatment well    Behavior During Therapy WFL for tasks assessed/performed              REFERRING DIAG: N94.10 (ICD-10-CM) - Unspecified dyspareunia  THERAPY DIAG:  Sacrococcygeal disorders, not elsewhere classified  Other muscle spasm  Chronic bilateral low back pain without sciatica  Other lack of coordination  Diastasis recti  TMJ dysfunction  Rationale for Evaluation and Treatment Rehabilitation  PERTINENT HISTORY: PRECAUTIONS: None   SUBJECTIVE:               Subjective Assessment - 11/16/21 1649     Subjective pt reported she had R sciatica when L sidelying with pillow beteen knees, rib area mm is popping on L, and L shoulder feels impinged. pt has been going to  her workouts and considering to changing her workout on line with another person because it will save time with commute. it also has a different approch with time under release of free weights.    Pertinent History Hx of twisted ankles R and L,  fall on ice skating in 2013 on R buttock, TMJ, not perinmenopausal yet but periods are shorter              [facility] PT Assessment - 11/16/21 1646       Palpation   Spinal mobility no reproduction of sciatic, less sideflexion compared to R    SI assessment  R iliac crest slightly lowered, R lumbar convex curve,    Palpation comment tightness / tenderness at intercostals T7-12, interspinals, paraspinals, medial/ posterior scap, posterior intercostals limited in posterior rotation, hypomobility at deviated T 10-12             [facility] Adult PT  Treatment/Exercise - 11/16/21 1647       Therapeutic Activites    Other Therapeutic Activities explained the balance between overhead workout causing more upper trap overuse and shoulder pain on L and important to add lat/ medtrap mm strengthening in workout to minimize relapse of mm imbalance      Neuro Re-ed    Neuro Re-ed Details  cued for quadriped with posterior rotation of trunk      Manual Therapy   Manual therapy comments STM/MWM at problem areas noted in assessment to minimize mm tensions and promote R posterior rotation of thoracic               PATIENT EDUCATION: Education details: see above ( Therapeutic Activity section)  Person educated: Patient Education method: Explanation, Demonstration, Tactile cues, and Verbal cues Education comprehension: verbalized understanding, returned demonstration, verbal cues required, and tactile cues required   HOME EXERCISE PROGRAM: Quadriped, posterior rotation on R Ensure lat/ lower trap / mid trap HEP balance out overhead workout to minimize relapse of mm imbalance      PT Long Term Goals       PT LONG TERM GOAL #1   Title Pt will demo proper body mechanics to minimize straining of abdomen and pelvic floor with fitness routine (modifications to sit-up/ crunches) (  weight lifting)    Time 4    Period Weeks    Status Achieved      PT LONG TERM GOAL #2   Title Pt will report no LBP occuring across across one month to continue with fitness and ADLs activities  ( 04/05/21: no more radiating pain and only soreness at times)    Time 8    Period Weeks    Status Achieved      PT LONG TERM GOAL #3   Title Pt will make modifcations to her sitting posture/ work station at work in order to minimize overactivity of pelvic floor    Time 8    Period Weeks    Status Achieved      PT LONG TERM GOAL #4   Title Pt will demo proper deep core coordination with proper diaphragmatic excursion in order to optimize postural stability and  pelvic floor function    Time 10    Period Weeks    Status Achieved      PT LONG TERM GOAL #5   Title Pt will report no straining with bowel movements 100% of the time and improved boewl frequency from 3-4x/week to daily or every other day across 2 weeks in order to restore pelvic floor function    Time 6    Period Weeks    Status Achieved      PT LONG TERM GOAL #6   Title Pt will demo decreased abdominal separation from 3 fingers width above umbilicus to < 1 fingers width and increased lateral diaphragmatic excursion in order to progress to proper pelvic floor lengthening to minimize pelvic pain and restore bowel movements and sexual function without difficulty    Time 10    Period Weeks    Status Achieved      PT LONG TERM GOAL #7   Title Pt will report decreased TMJ pain from 4/10 to < 1/10 and be IND with relaxation practices in order to increase QOL  ( 04/05/21: 3/10,  05/04/21:  3/10 , 09/14/21: 3/10)    Time 8    Period Weeks    Status Partially Met    Target Date 11/09/21      PT LONG TERM GOAL #8   Title Pt will demo modifications to fitness routine with IND and proper alignment to strengthen underused mm systems to minimize risk for injuries    Time 10    Period Weeks    Status Achieved      PT LONG TERM GOAL  #9   TITLE Pt will demo no tightness of pelvic floor mm across 2 visits in order restore pelvic function    Time 10    Period Weeks    Status Partially Met    Target Date 11/23/21      PT LONG TERM GOAL  #10   TITLE Pt will demo increased cervical endurance test from 2:53 to > 3:30  in order to improve head posture and decreased overuse of  upper trap/ scalenes to return to ull ups with less risk of injuries  ( 04/05/21                                             3: 55 sec)    Time 10    Period Weeks    Status Achieved    Target Date 05/17/21  PT LONG TERM GOAL  #11   TITLE Pt will demo no upper trap overuse with proper cervical retraction and deep shoulder  / thoraco mm co-activation with 3 pullup ( shoulderabduction) without issues after one set across     2 days    Time 10    Period Weeks    Status Deferred    Target Date 07/14/21      PT LONG TERM GOAL  #12   TITLE Pt will demo no increased L paraspinal mm tightness and be compliant with complimentary stretches for L rotation to balance out dancing which involces more R thoracic rotation in order to enjoy dancing with less relapse of spinal asymmetries    Time 8    Period Weeks    Status New    Target Date 11/09/21              Plan - 11/16/21 1659     Clinical Impression Statement Pt demo'd good good carry with postural stability across the past months. Pt showed tightness along R upper quadrant and limited posterior rotation of thoracic/ intercostals, hypomobile of thoracic segments. Pt was educated ways to minimize relapse of mm imbalance with workout selection. Pt continues to benefit from skilled PT   Personal Factors and Comorbidities Fitness;Other    Stability/Clinical Decision Making Evolving/Moderate complexity    Clinical Decision Making Moderate    Rehab Potential Good    PT Frequency Biweekly    PT Duration Other (comment)   10   PT Treatment/Interventions Balance training;Neuromuscular re-education;Gait training;Moist Heat;Functional mobility training;Therapeutic activities;Patient/family education;Manual techniques;Therapeutic exercise;Taping;Spinal Manipulations;Joint Manipulations;Scar mobilization;Stair training;Traction;Energy conservation    Consulted and Agree with Plan of Care Patient               Jerl Mina, PT 11/16/2021, 5:01 PM

## 2021-12-21 ENCOUNTER — Ambulatory Visit: Payer: BC Managed Care – PPO | Attending: Obstetrics and Gynecology | Admitting: Physical Therapy

## 2021-12-21 DIAGNOSIS — M62838 Other muscle spasm: Secondary | ICD-10-CM | POA: Insufficient documentation

## 2021-12-21 DIAGNOSIS — M533 Sacrococcygeal disorders, not elsewhere classified: Secondary | ICD-10-CM | POA: Diagnosis present

## 2021-12-21 DIAGNOSIS — R278 Other lack of coordination: Secondary | ICD-10-CM | POA: Insufficient documentation

## 2021-12-21 NOTE — Therapy (Signed)
[place] MAIN [facility] SERVICES [address] [place], [place], [address] Phone: [phone]   Fax:  [phone]  Physical Therapy Treatment / recert   Patient Details  Name: Dorothy Owens MRN: [id number] Date of Birth: [birth year] No data recorded   Encounter Date: 12/21/2021   PT End of Session - 12/21/21 1450     Visit Number 42    Date for PT Re-Evaluation 03/01/22    PT Start Time 1331    PT Stop Time 1415    PT Time Calculation (min) 44 min    Activity Tolerance Patient tolerated treatment well    Behavior During Therapy [facility] for tasks assessed/performed             No past medical history on file.  No past surgical history on file.  There were no vitals filed for this visit.    Subjective Assessment - 12/21/21 1457     Subjective Pt reported she has been using an online fitness program 5 x a week. Pt had a question about a row exercise from program.  had been doing deep core, cross diagonal  exercises. Pt has not been doing mulitifidis, rotator cuff, all of the pelvic floor stretches .    Pertinent History Hx of twisted ankles R and L,  fall on ice skating in 2013 on R buttock, TMJ, not perinmenopausal yet but periods are shorter                 [facility] PT Assessment - 12/21/21 1451       Observation/Other Assessments   Observations simulated chest row that sheperformed with online program,      Coordination   Coordination and Movement Description good carry over with less lordosis at T/L, cervical / scapular retraction with chest rows ,  poor hand placement in tricep curls with resistance band      Palpation   Spinal mobility no thoracic rotation present    SI assessment  levelled pelvic girdle, R shoudler slightly higher,    Palpation comment tightness along T10-12 intercostals, medial scap, R                        Pelvic Floor Special Questions - 12/21/21 1454     Pelvic Floor Internal Exam  through clothing: no tenderness/ tightness along B pelvic floor mm               [facility] Adult PT Treatment/Exercise - 12/21/21 1453       Therapeutic Activites    Other Therapeutic Activities explained principles of isotonic, isometric, and resistance strneghtening, active listening to her new workout routine with online resource,      Neuro Re-ed    Neuro Re-ed Details  cued for resistance band chest rows/ triceps as alternative to dumbbells      Manual Therapy   Manual therapy comments STM/MWM at problem areas noted in assessment to minimize mm tensions and promote scapular mobility                          PT Long Term Goals -       PT LONG TERM GOAL #1   Title Pt will demo proper body mechanics to minimize straining of abdomen and pelvic floor with fitness routine (modifications to sit-up/ crunches) ( weight lifting)    Time 4    Period Weeks    Status Achieved  PT LONG TERM GOAL #2   Title Pt will report no LBP occuring across across one month to continue with fitness and ADLs activities  ( 04/05/21: no more radiating pain and only soreness at times)    Time 8    Period Weeks    Status Achieved      PT LONG TERM GOAL #3   Title Pt will make modifcations to her sitting posture/ work station at work in order to minimize overactivity of pelvic floor    Time 8    Period Weeks    Status Achieved      PT LONG TERM GOAL #4   Title Pt will demo proper deep core coordination with proper diaphragmatic excursion in order to optimize postural stability and pelvic floor function    Time 10    Period Weeks    Status Achieved      PT LONG TERM GOAL #5   Title Pt will report no straining with bowel movements 100% of the time and improved boewl frequency from 3-4x/week to daily or every other day across 2 weeks in order to restore pelvic floor function    Time 6    Period Weeks    Status Achieved      PT LONG TERM GOAL #6   Title Pt will demo decreased  abdominal separation from 3 fingers width above umbilicus to < 1 fingers width and increased lateral diaphragmatic excursion in order to progress to proper pelvic floor lengthening to minimize pelvic pain and restore bowel movements and sexual function without difficulty    Time 10    Period Weeks    Status Achieved      PT LONG TERM GOAL #7   Title Pt will report decreased TMJ pain from 4/10 to < 1/10 and be IND with relaxation practices in order to increase QOL  ( 04/05/21: 3/10,  05/04/21:  3/10 , 09/14/21: 3/10)    Time 8    Period Weeks    Status Partially Met    Target Date 11/09/21      PT LONG TERM GOAL #8   Title Pt will demo modifications to fitness routine with IND and proper alignment to strengthen underused mm systems to minimize risk for injuries    Time 10    Period Weeks    Status Achieved      PT LONG TERM GOAL  #9   TITLE Pt will demo no tightness of pelvic floor mm across 2 visits in order restore pelvic function    Time 10    Period Weeks    Status Partially Met    Target Date 03/01/2022       PT LONG TERM GOAL  #10   TITLE Pt will demo increased cervical endurance test from 2:53 to > 3:30  in order to improve head posture and decreased overuse of  upper trap/ scalenes to return to ull ups with less risk of injuries  ( 04/05/21                                             3: 55 sec)    Time 10    Period Weeks    Status Achieved    Target Date 05/17/21      PT LONG TERM GOAL  #11   TITLE Pt will demo no upper trap overuse with  proper cervical retraction and deep shoulder / thoraco mm co-activation with 3 pullup ( shoulderabduction) without issues after one set across     2 days    Time 10    Period Weeks    Status Deferred    Target Date 07/14/21      PT LONG TERM GOAL  #12   TITLE Pt will demo no increased L paraspinal mm tightness and be compliant with complimentary stretches for L rotation to balance out dancing which involves more R thoracic rotation in order  to enjoy dancing with less relapse of spinal asymmetries    Time 8    Period Weeks    Status New    Target Date 02/15/2022                       Plan -     Clinical Impression Statement Pt has demonstrated good carry over with pelvic and spinal alignment across the past month . Pt also showed no tenderness/ tightness of pelvic floor which is good carry over.    Pt has found an online fitness progress which is working for her but she had noticed shoulder pain with use of heavier dumbbell . Pt decreased her dumbbell weight. Assessment today showed hypomobile T segment T10-12 and intercostals with decreased downward rotation of R scapula. Pt no longer showed thoracic rotation. Pt showed improvements in these areas after manual Tx . Pt required more explanation about principles of exercise physiology to help make modifications to use of dumbbells to minimize injuries. Relapse of Sx. Pt voiced understanding. Pt demo'd improved form with use of resistance bands for rows and triceps following training.   Pt continues to benefit from skilled PT with once a month frequency. Plan to see how her new online fitness program is working for her . Educated pt on which HEP to maintain to minimize imbalance of mm overuse and relapse of Sx.       Personal Factors and Comorbidities Fitness;Other    Stability/Clinical Decision Making Evolving/Moderate complexity    Rehab Potential Good    PT Frequency 1x / month   PT Duration Other (comment)   10   PT Treatment/Interventions Balance training;Neuromuscular re-education;Gait training;Moist Heat;Functional mobility training;Therapeutic activities;Patient/family education;Manual techniques;Therapeutic exercise;Taping;Spinal Manipulations;Joint Manipulations;Scar mobilization;Stair training;Traction;Energy conservation    Consulted and Agree with Plan of Care Patient             Patient will benefit from skilled therapeutic intervention in order to  improve the following deficits and impairments:     Visit Diagnosis: Sacrococcygeal disorders, not elsewhere classified  Other muscle spasm  Other lack of coordination     Problem List There are no problems to display for this patient.   Jerl Mina, PT 12/21/2021, 2:56 PM  [place] MAIN [facility] SERVICES [address] [place], [place], [address] Phone: [phone]   Fax:  [phone]  Name: Dorothy Owens MRN: [id number] Date of Birth: 07-05-1970

## 2022-01-25 ENCOUNTER — Other Ambulatory Visit: Payer: Self-pay | Admitting: Obstetrics and Gynecology

## 2022-01-25 ENCOUNTER — Ambulatory Visit: Payer: BC Managed Care – PPO | Attending: Obstetrics and Gynecology | Admitting: Physical Therapy

## 2022-01-25 DIAGNOSIS — G8929 Other chronic pain: Secondary | ICD-10-CM | POA: Diagnosis present

## 2022-01-25 DIAGNOSIS — M62838 Other muscle spasm: Secondary | ICD-10-CM | POA: Diagnosis present

## 2022-01-25 DIAGNOSIS — R928 Other abnormal and inconclusive findings on diagnostic imaging of breast: Secondary | ICD-10-CM

## 2022-01-25 DIAGNOSIS — M26609 Unspecified temporomandibular joint disorder, unspecified side: Secondary | ICD-10-CM | POA: Diagnosis present

## 2022-01-25 DIAGNOSIS — M533 Sacrococcygeal disorders, not elsewhere classified: Secondary | ICD-10-CM | POA: Insufficient documentation

## 2022-01-25 DIAGNOSIS — R278 Other lack of coordination: Secondary | ICD-10-CM | POA: Diagnosis present

## 2022-01-25 DIAGNOSIS — M545 Low back pain, unspecified: Secondary | ICD-10-CM | POA: Insufficient documentation

## 2022-01-25 DIAGNOSIS — M6208 Separation of muscle (nontraumatic), other site: Secondary | ICD-10-CM | POA: Diagnosis present

## 2022-01-25 NOTE — Therapy (Signed)
[place] MAIN [facility] SERVICES [address] [place], [place], [address] Phone: [phone]   Fax:  [phone]  Physical Therapy Treatment  Patient Details  Name: Dorothy Owens MRN: [id number] Date of Birth: 17-Jul-1970 No data recorded   Encounter Date: 01/25/2022   PT End of Session - 01/25/22 1615     Visit Number 43    Date for PT Re-Evaluation 03/01/22    PT Start Time 1550    PT Stop Time 1640    PT Time Calculation (min) 50 min    Activity Tolerance Patient tolerated treatment well    Behavior During Therapy [facility] for tasks assessed/performed             No past medical history on file.  No past surgical history on file.  There were no vitals filed for this visit.   Subjective Assessment - 01/25/22 1616     Subjective Pt reported after dancing last week, pt noticed sciatic pain down R glut. R midback by shoulder blade is still tight. Pt also sued fitness app instead of gym classes the past 2 months.    Pertinent History Hx of twisted ankles R and L,  fall on ice skating in 2013 on R buttock, TMJ, not perinmenopausal yet but periods are shorter                 [facility] PT Assessment - 01/25/22 1652       Palpation   SI assessment  R shoulder, pelvic girdle higher    Palpation comment tightness along R SIJ, lateral tib/fib, adductor, ITband, intercostal / Tspine T7-10 , interspinals/ paraspinals mm R               [facility] Adult PT Treatment/Exercise - 01/25/22 1655       Therapeutic Activites    Other Therapeutic Activities active listening to updates      Neuro Re-ed    Neuro Re-ed Details  cued for thoracic mobility      Manual Therapy   Manual therapy comments STM/MWM at problem areas noted in assessment to minimize mm tensions and promote scapular mobility and pelvic girdle alignment                   PT Long Term Goals -       PT LONG TERM GOAL #1   Title Pt will demo proper body  mechanics to minimize straining of abdomen and pelvic floor with fitness routine (modifications to sit-up/ crunches) ( weight lifting)    Time 4    Period Weeks    Status Achieved      PT LONG TERM GOAL #2   Title Pt will report no LBP occuring across across one month to continue with fitness and ADLs activities  ( 04/05/21: no more radiating pain and only soreness at times)    Time 8    Period Weeks    Status Achieved      PT LONG TERM GOAL #3   Title Pt will make modifcations to her sitting posture/ work station at work in order to minimize overactivity of pelvic floor    Time 8    Period Weeks    Status Achieved      PT LONG TERM GOAL #4   Title Pt will demo proper deep core coordination with proper diaphragmatic excursion in order to optimize postural stability and pelvic floor function    Time 10    Period Weeks  Status Achieved      PT LONG TERM GOAL #5   Title Pt will report no straining with bowel movements 100% of the time and improved boewl frequency from 3-4x/week to daily or every other day across 2 weeks in order to restore pelvic floor function    Time 6    Period Weeks    Status Achieved      PT LONG TERM GOAL #6   Title Pt will demo decreased abdominal separation from 3 fingers width above umbilicus to < 1 fingers width and increased lateral diaphragmatic excursion in order to progress to proper pelvic floor lengthening to minimize pelvic pain and restore bowel movements and sexual function without difficulty    Time 10    Period Weeks    Status Achieved      PT LONG TERM GOAL #7   Title Pt will report decreased TMJ pain from 4/10 to < 1/10 and be IND with relaxation practices in order to increase QOL  ( 04/05/21: 3/10,  05/04/21:  3/10 , 09/14/21: 3/10)    Time 8    Period Weeks    Status Partially Met    Target Date 11/09/21      PT LONG TERM GOAL #8   Title Pt will demo modifications to fitness routine with IND and proper alignment to strengthen underused mm  systems to minimize risk for injuries    Time 10    Period Weeks    Status Achieved      PT LONG TERM GOAL  #9   TITLE Pt will demo no tightness of pelvic floor mm across 2 visits in order restore pelvic function    Time 10    Period Weeks    Status Partially Met    Target Date 03/01/2022       PT LONG TERM GOAL  #10   TITLE Pt will demo increased cervical endurance test from 2:53 to > 3:30  in order to improve head posture and decreased overuse of  upper trap/ scalenes to return to ull ups with less risk of injuries  ( 04/05/21                                             3: 55 sec)    Time 10    Period Weeks    Status Achieved    Target Date 05/17/21      PT LONG TERM GOAL  #11   TITLE Pt will demo no upper trap overuse with proper cervical retraction and deep shoulder / thoraco mm co-activation with 3 pullup ( shoulderabduction) without issues after one set across     2 days    Time 10    Period Weeks    Status Deferred    Target Date 07/14/21      PT LONG TERM GOAL  #12   TITLE Pt will demo no increased L paraspinal mm tightness and be compliant with complimentary stretches for L rotation to balance out dancing which involves more R thoracic rotation in order to enjoy dancing with less relapse of spinal asymmetries    Time 8    Period Weeks    Status New    Target Date 02/15/2022                       Plan -  Clinical Impression Statement Pt showed minimal tightness of upper trap since using her fitness app program instead of gym classes.  Pt showed R shoulder and R pelvic girdle higher than L today which was related to tightness of mm along these areas. Spinal segments were not deviated as in the past which indicates more stability. Pt demo'd decreased tightness of mm post Tx.  Plan to add HIIT exercises as pt does not have cardio in her fitness routine currently.   Pt continues to benefit from skilled PT        Personal Factors and Comorbidities  Fitness;Other    Stability/Clinical Decision Making Evolving/Moderate complexity    Rehab Potential Good    PT Frequency 1x / month   PT Duration Other (comment)   10   PT Treatment/Interventions Balance training;Neuromuscular re-education;Gait training;Moist Heat;Functional mobility training;Therapeutic activities;Patient/family education;Manual techniques;Therapeutic exercise;Taping;Spinal Manipulations;Joint Manipulations;Scar mobilization;Stair training;Traction;Energy conservation    Consulted and Agree with Plan of Care Patient             Patient will benefit from skilled therapeutic intervention in order to improve the following deficits and impairments:     Visit Diagnosis: Sacrococcygeal disorders, not elsewhere classified  Other muscle spasm  Other lack of coordination  Chronic bilateral low back pain without sciatica  Diastasis recti  TMJ dysfunction     Problem List There are no problems to display for this patient.   Jerl Mina, PT 01/25/2022, 5:05 PM  [place] MAIN [facility] SERVICES [address] [place], [place], [address] Phone: [phone]   Fax:  [phone]  Name: Kesa Birky MRN: [id number] Date of Birth: 12-01-1970

## 2022-01-31 ENCOUNTER — Other Ambulatory Visit: Payer: Self-pay | Admitting: Obstetrics and Gynecology

## 2022-01-31 ENCOUNTER — Ambulatory Visit
Admission: RE | Admit: 2022-01-31 | Discharge: 2022-01-31 | Disposition: A | Discharge status: Home / Self Care | Payer: BC Managed Care – PPO | Source: Ambulatory Visit | Attending: Obstetrics and Gynecology | Admitting: Obstetrics and Gynecology

## 2022-01-31 ENCOUNTER — Ambulatory Visit: Admission: RE | Admit: 2022-01-31 | Payer: BC Managed Care – PPO | Source: Ambulatory Visit

## 2022-01-31 ENCOUNTER — Ambulatory Visit: Payer: BC Managed Care – PPO

## 2022-01-31 DIAGNOSIS — R921 Mammographic calcification found on diagnostic imaging of breast: Secondary | ICD-10-CM

## 2022-01-31 DIAGNOSIS — R928 Other abnormal and inconclusive findings on diagnostic imaging of breast: Secondary | ICD-10-CM

## 2022-02-08 ENCOUNTER — Ambulatory Visit: Payer: BC Managed Care – PPO | Attending: Obstetrics and Gynecology | Admitting: Physical Therapy

## 2022-02-08 DIAGNOSIS — M6208 Separation of muscle (nontraumatic), other site: Secondary | ICD-10-CM | POA: Insufficient documentation

## 2022-02-08 DIAGNOSIS — M62838 Other muscle spasm: Secondary | ICD-10-CM | POA: Diagnosis present

## 2022-02-08 DIAGNOSIS — M545 Low back pain, unspecified: Secondary | ICD-10-CM | POA: Insufficient documentation

## 2022-02-08 DIAGNOSIS — M533 Sacrococcygeal disorders, not elsewhere classified: Secondary | ICD-10-CM | POA: Diagnosis not present

## 2022-02-08 DIAGNOSIS — G8929 Other chronic pain: Secondary | ICD-10-CM | POA: Diagnosis present

## 2022-02-08 DIAGNOSIS — R278 Other lack of coordination: Secondary | ICD-10-CM | POA: Diagnosis present

## 2022-02-08 DIAGNOSIS — M26609 Unspecified temporomandibular joint disorder, unspecified side: Secondary | ICD-10-CM | POA: Insufficient documentation

## 2022-02-08 NOTE — Therapy (Addendum)
[place] MAIN [facility] SERVICES [address] [place], [place], [address] Phone: [phone]   Fax:  [phone]  Physical Therapy Treatment / Recert   Patient Details  Name: Dorothy Owens MRN: [id number] Date of Birth: 1970/06/29 No data recorded   Encounter Date: 02/08/2022   PT End of Session - 02/08/22 1445     Visit Number 44    Date for PT Re-Evaluation 04/19/2022     PT Start Time 4580    PT Stop Time 1500    PT Time Calculation (min) 40 min    Activity Tolerance Patient tolerated treatment well    Behavior During Therapy [facility] for tasks assessed/performed             No past medical history on file.  No past surgical history on file.  There were no vitals filed for this visit.   Subjective Assessment - 02/08/22 1453     Subjective Pt reports neck / shoulder pain is better since Tx. Sciatic still creeps up sometimes.    Pertinent History Hx of twisted ankles R and L,  fall on ice skating in 2013 on R buttock, TMJ, not perinmenopausal yet but periods are shorter                 Pelvic Floor Special Questions - 02/08/22 1446     Pelvic Floor Internal Exam pt consented verbally and had no contraindications    Exam Type Vaginal    Palpation tightness and tenderness behind pubic symphysis, no lowered urethra/ bladder                [facility] Adult PT Treatment/Exercise - 02/08/22 1446       Neuro Re-ed    Neuro Re-ed Details  cued for transverse arch of feet, anterior tilt of pelvis to relax anterior pelvic floor      Modalities   Modalities Moist Heat      Moist Heat Therapy   Number Minutes Moist Heat 5 Minutes    Moist Heat Location --   unbilled, butterfly , supported     Manual Therapy   Internal Pelvic Floor STM/MWM at problem areas noted in assessment to promote more anterior tilt of pelvis and less pain                   PT Long Term Goals -       PT LONG TERM GOAL #1    Title Pt will demo proper body mechanics to minimize straining of abdomen and pelvic floor with fitness routine (modifications to sit-up/ crunches) ( weight lifting)    Time 4    Period Weeks    Status Achieved      PT LONG TERM GOAL #2   Title Pt will report no LBP occuring across across one month to continue with fitness and ADLs activities  ( 04/05/21: no more radiating pain and only soreness at times)    Time 8    Period Weeks    Status Achieved      PT LONG TERM GOAL #3   Title Pt will make modifcations to her sitting posture/ work station at work in order to minimize overactivity of pelvic floor    Time 8    Period Weeks    Status Achieved      PT LONG TERM GOAL #4   Title Pt will demo proper deep core coordination with proper diaphragmatic excursion in order to optimize postural stability  and pelvic floor function    Time 10    Period Weeks    Status Achieved      PT LONG TERM GOAL #5   Title Pt will report no straining with bowel movements 100% of the time and improved boewl frequency from 3-4x/week to daily or every other day across 2 weeks in order to restore pelvic floor function    Time 6    Period Weeks    Status Achieved      PT LONG TERM GOAL #6   Title Pt will demo decreased abdominal separation from 3 fingers width above umbilicus to < 1 fingers width and increased lateral diaphragmatic excursion in order to progress to proper pelvic floor lengthening to minimize pelvic pain and restore bowel movements and sexual function without difficulty    Time 10    Period Weeks    Status Achieved      PT LONG TERM GOAL #7   Title Pt will report decreased TMJ pain from 4/10 to < 1/10 and be IND with relaxation practices in order to increase QOL  ( 04/05/21: 3/10,  05/04/21:  3/10 , 09/14/21: 3/10)    Time 8    Period Weeks    Status Partially Met    Target Date 11/09/21      PT LONG TERM GOAL #8   Title Pt will demo modifications to fitness routine with IND and proper  alignment to strengthen underused mm systems to minimize risk for injuries    Time 10    Period Weeks    Status Achieved      PT LONG TERM GOAL  #9   TITLE Pt will demo no tightness of pelvic floor mm across 2 visits in order restore pelvic function    Time 10    Period Weeks    Status Partially Met    Target Date 04/05/2022        PT LONG TERM GOAL  #10   TITLE Pt will demo increased cervical endurance test from 2:53 to > 3:30  in order to improve head posture and decreased overuse of  upper trap/ scalenes to return to ull ups with less risk of injuries  ( 04/05/21                                             3: 55 sec)    Time 10    Period Weeks    Status Achieved    Target Date 05/17/21      PT LONG TERM GOAL  #11   TITLE Pt will demo no upper trap overuse with proper cervical retraction and deep shoulder / thoraco mm co-activation with 3 pullup ( shoulderabduction) without issues after one set across     2 days    Time 10    Period Weeks    Status Deferred    Target Date 07/14/21      PT LONG TERM GOAL  #12   TITLE Pt will demo no increased L paraspinal mm tightness and be compliant with complimentary stretches for L rotation to balance out dancing which involves more R thoracic rotation in order to enjoy dancing with less relapse of spinal asymmetries    Time 8    Period Weeks    Status New    Target Date 04/19/2022  Plan -     Clinical Impression Statement Pt required internal pelvic floor assessment and Tx. Pt demo'd only anterior mm tightness/ tenderness and no more lower position of urethra/ bladder.   Pt has selected a different workout program while maintaining PT HEP which has helped pt minimize imbalance of mm tensions in upper and lower body. However, this   Plan to add HIIT exercises as pt does not have cardio in her fitness routine currently.   Changing POC to 2 x month instead of 1 x month to focus on remaining goals  related to TMJ, pelvic , and LBP and cardio fitness goals.  Pt continues to benefit from skilled PT        Personal Factors and Comorbidities Fitness;Other    Stability/Clinical Decision Making Evolving/Moderate complexity    Rehab Potential Good    PT Frequency 2x / month   PT Duration Other (comment)   10   PT Treatment/Interventions Balance training;Neuromuscular re-education;Gait training;Moist Heat;Functional mobility training;Therapeutic activities;Patient/family education;Manual techniques;Therapeutic exercise;Taping;Spinal Manipulations;Joint Manipulations;Scar mobilization;Stair training;Traction;Energy conservation    Consulted and Agree with Plan of Care Patient                 Patient will benefit from skilled therapeutic intervention in order to improve the following deficits and impairments:  Increased muscle spasms,Decreased mobility,Decreased coordination,Decreased endurance,Decreased activity tolerance,Decreased range of motion,Decreased strength,Improper body mechanics,Pain,Postural dysfunction    Visit Diagnosis: Sacrococcygeal disorders, not elsewhere classified  Other muscle spasm  Other lack of coordination  Chronic bilateral low back pain without sciatica  TMJ dysfunction  Diastasis recti     Problem List There are no problems to display for this patient.   Jerl Mina, PT 02/08/2022, 2:56 PM  [place] MAIN [facility] SERVICES [address] [place], [place], [address] Phone: [phone]   Fax:  [phone]  Name: Dorothy Owens MRN: [id number] Date of Birth: February 20, 1971

## 2022-02-21 ENCOUNTER — Ambulatory Visit: Payer: BC Managed Care – PPO | Admitting: Physical Therapy

## 2022-02-28 ENCOUNTER — Other Ambulatory Visit: Payer: BC Managed Care – PPO

## 2022-03-07 ENCOUNTER — Ambulatory Visit: Payer: BC Managed Care – PPO | Attending: Obstetrics and Gynecology | Admitting: Physical Therapy

## 2022-03-07 DIAGNOSIS — M26609 Unspecified temporomandibular joint disorder, unspecified side: Secondary | ICD-10-CM | POA: Diagnosis present

## 2022-03-07 DIAGNOSIS — M545 Low back pain, unspecified: Secondary | ICD-10-CM | POA: Diagnosis present

## 2022-03-07 DIAGNOSIS — G8929 Other chronic pain: Secondary | ICD-10-CM | POA: Insufficient documentation

## 2022-03-07 DIAGNOSIS — R278 Other lack of coordination: Secondary | ICD-10-CM | POA: Insufficient documentation

## 2022-03-07 DIAGNOSIS — M533 Sacrococcygeal disorders, not elsewhere classified: Secondary | ICD-10-CM | POA: Insufficient documentation

## 2022-03-07 DIAGNOSIS — M62838 Other muscle spasm: Secondary | ICD-10-CM | POA: Insufficient documentation

## 2022-03-07 NOTE — Therapy (Signed)
[place] MAIN [facility] SERVICES [address] [place], [place], [address] Phone: [phone]   Fax:  [phone]  Physical Therapy Treatment  Patient Details  Name: Dorothy Owens MRN: [id number] Date of Birth: May 09, 1970 No data recorded   Encounter Date: 03/07/2022   PT End of Session - 02/08/22 1445     Visit Number 44    Date for PT Re-Evaluation 04/19/2022     PT Start Time 1420    PT Stop Time 1500    PT Time Calculation (min) 40 min    Activity Tolerance Patient tolerated treatment well    Behavior During Therapy [facility] for tasks assessed/performed             No past medical history on file.  No past surgical history on file.  There were no vitals filed for this visit.         Pelvic Floor Special Questions - 03/07/22 1424     Pelvic Floor Internal Exam pt consented verbally and had no contraindications    Exam Type Vaginal    Palpation tightness decreased at 1-2nd layers,  and tenderness at obt int B behind pubic symphysis, no lowered urethra/ bladder                [facility] Adult PT Treatment/Exercise - 03/07/22 1424       Modalities   Modalities Moist Heat      Moist Heat Therapy   Number Minutes Moist Heat 4 Minutes    Moist Heat Location --   perineum , butterfly supported     Manual Therapy   Internal Pelvic Floor STM/MWM at problem areas noted in assessment to promote more anterior tilt of pelvis and less pain   Therapeautic Activity                                                       Discussed fitness modifications and pelvic pain                  PT Long Term Goals -       PT LONG TERM GOAL #1   Title Pt will demo proper body mechanics to minimize straining of abdomen and pelvic floor with fitness routine (modifications to sit-up/ crunches) ( weight lifting)    Time 4    Period Weeks    Status Achieved      PT LONG TERM GOAL #2   Title Pt will report no LBP occuring across  across one month to continue with fitness and ADLs activities  ( 04/05/21: no more radiating pain and only soreness at times)    Time 8    Period Weeks    Status Achieved      PT LONG TERM GOAL #3   Title Pt will make modifcations to her sitting posture/ work station at work in order to minimize overactivity of pelvic floor    Time 8    Period Weeks    Status Achieved      PT LONG TERM GOAL #4   Title Pt will demo proper deep core coordination with proper diaphragmatic excursion in order to optimize postural stability and pelvic floor function    Time 10    Period Weeks    Status Achieved      PT  LONG TERM GOAL #5   Title Pt will report no straining with bowel movements 100% of the time and improved boewl frequency from 3-4x/week to daily or every other day across 2 weeks in order to restore pelvic floor function    Time 6    Period Weeks    Status Achieved      PT LONG TERM GOAL #6   Title Pt will demo decreased abdominal separation from 3 fingers width above umbilicus to < 1 fingers width and increased lateral diaphragmatic excursion in order to progress to proper pelvic floor lengthening to minimize pelvic pain and restore bowel movements and sexual function without difficulty    Time 10    Period Weeks    Status Achieved      PT LONG TERM GOAL #7   Title Pt will report decreased TMJ pain from 4/10 to < 1/10 and be IND with relaxation practices in order to increase QOL  ( 04/05/21: 3/10,  05/04/21:  3/10 , 09/14/21: 3/10)    Time 8    Period Weeks    Status Partially Met    Target Date 11/09/21      PT LONG TERM GOAL #8   Title Pt will demo modifications to fitness routine with IND and proper alignment to strengthen underused mm systems to minimize risk for injuries    Time 10    Period Weeks    Status Achieved      PT LONG TERM GOAL  #9   TITLE Pt will demo no tightness of pelvic floor mm across 2 visits in order restore pelvic function    Time 10    Period Weeks     Status Partially Met    Target Date 04/05/2022        PT LONG TERM GOAL  #10   TITLE Pt will demo increased cervical endurance test from 2:53 to > 3:30  in order to improve head posture and decreased overuse of  upper trap/ scalenes to return to ull ups with less risk of injuries  ( 04/05/21                                             3: 55 sec)    Time 10    Period Weeks    Status Achieved    Target Date 05/17/21      PT LONG TERM GOAL  #11   TITLE Pt will demo no upper trap overuse with proper cervical retraction and deep shoulder / thoraco mm co-activation with 3 pullup ( shoulderabduction) without issues after one set across     2 days    Time 10    Period Weeks    Status Deferred    Target Date 07/14/21      PT LONG TERM GOAL  #12   TITLE Pt will demo no increased L paraspinal mm tightness and be compliant with complimentary stretches for L rotation to balance out dancing which involves more R thoracic rotation in order to enjoy dancing with less relapse of spinal asymmetries    Time 8    Period Weeks    Status New    Target Date 04/19/2022                         Plan -     Clinical Impression  Statement Pt required internal pelvic floor assessment and Tx. Pt demo'd lowered position of urethra/ bladder again and only tightness at B obturator internus. Reinforced maintaining pelvic floor stretches. Plan to continue to review and assess fitness routine.   Pt continues to benefit from skilled PT        Personal Factors and Comorbidities Fitness;Other    Stability/Clinical Decision Making Evolving/Moderate complexity    Rehab Potential Good    PT Frequency 2x / month   PT Duration Other (comment)   10   PT Treatment/Interventions Balance training;Neuromuscular re-education;Gait training;Moist Heat;Functional mobility training;Therapeutic activities;Patient/family education;Manual techniques;Therapeutic exercise;Taping;Spinal Manipulations;Joint  Manipulations;Scar mobilization;Stair training;Traction;Energy conservation    Consulted and Agree with Plan of Care Patient                 Patient will benefit from skilled therapeutic intervention in order to improve the following deficits and impairments:  Increased muscle spasms,Decreased mobility,Decreased coordination,Decreased endurance,Decreased activity tolerance,Decreased range of motion,Decreased strength,Improper body mechanics,Pain,Postural dysfunction    Visit Diagnosis: Sacrococcygeal disorders, not elsewhere classified  Other muscle spasm  Other lack of coordination  Chronic bilateral low back pain without sciatica  TMJ dysfunction     Problem List There are no problems to display for this patient.   Jerl Mina, PT 03/07/2022, 2:27 PM  [place] MAIN [facility] SERVICES [address] [place], [place], [address] Phone: [phone]   Fax:  [phone]  Name: Dorothy Owens MRN: [id number] Date of Birth: 06-09-1970

## 2022-03-28 ENCOUNTER — Ambulatory Visit: Payer: BC Managed Care – PPO | Admitting: Physical Therapy

## 2022-04-04 IMAGING — MG DIGITAL DIAGNOSTIC BILAT W/ TOMO W/ CAD
8 of 16 series · 8 of 40 positions shown · non-contrast
Comparison: Previous exam(s).

CLINICAL DATA: Patient for follow-up of right breast
calcifications.



[R CC (1 of 2)]
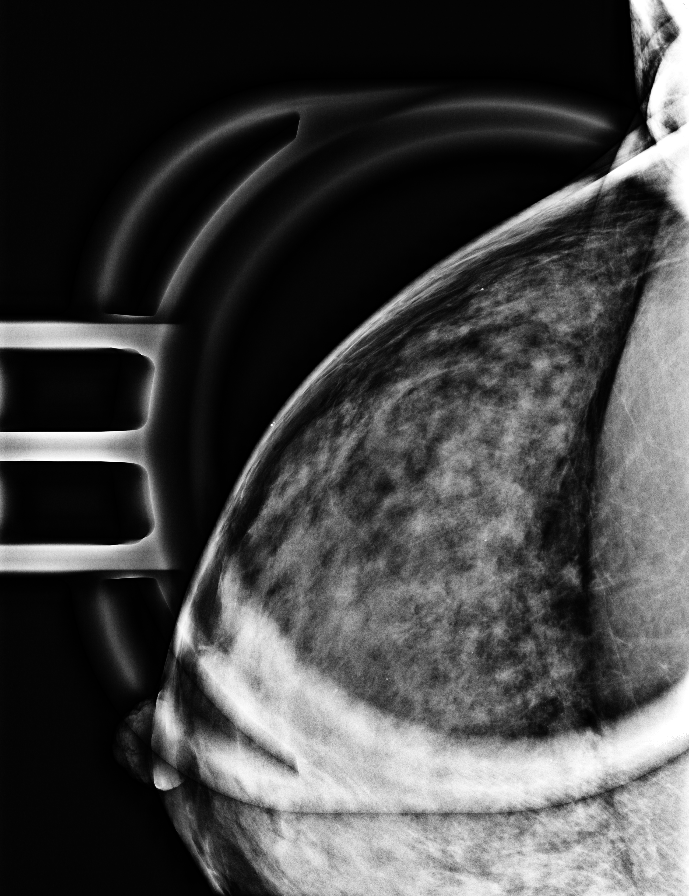

[R ML (1 of 2)]
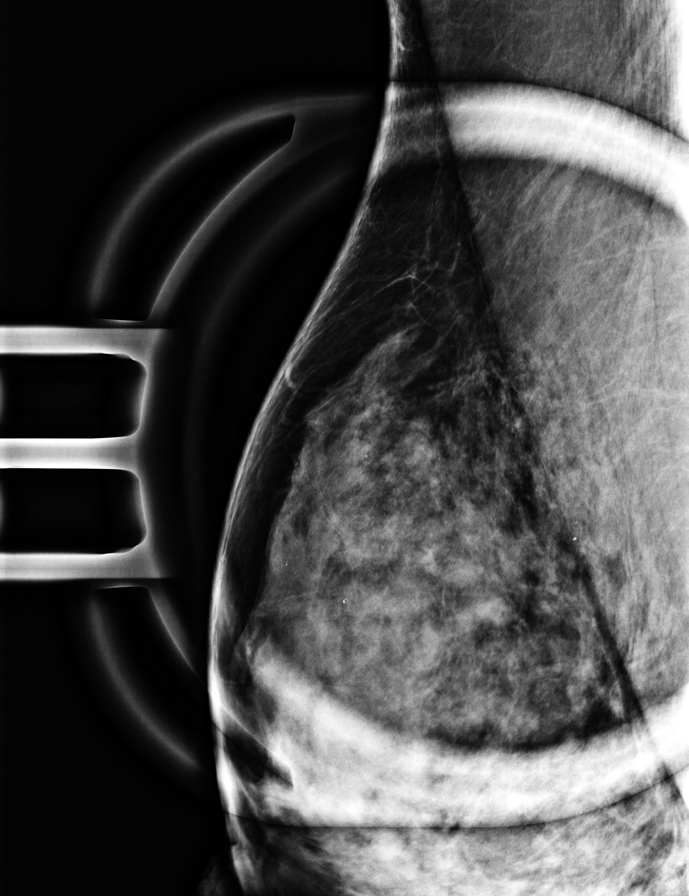

[R CC (2 of 2)]
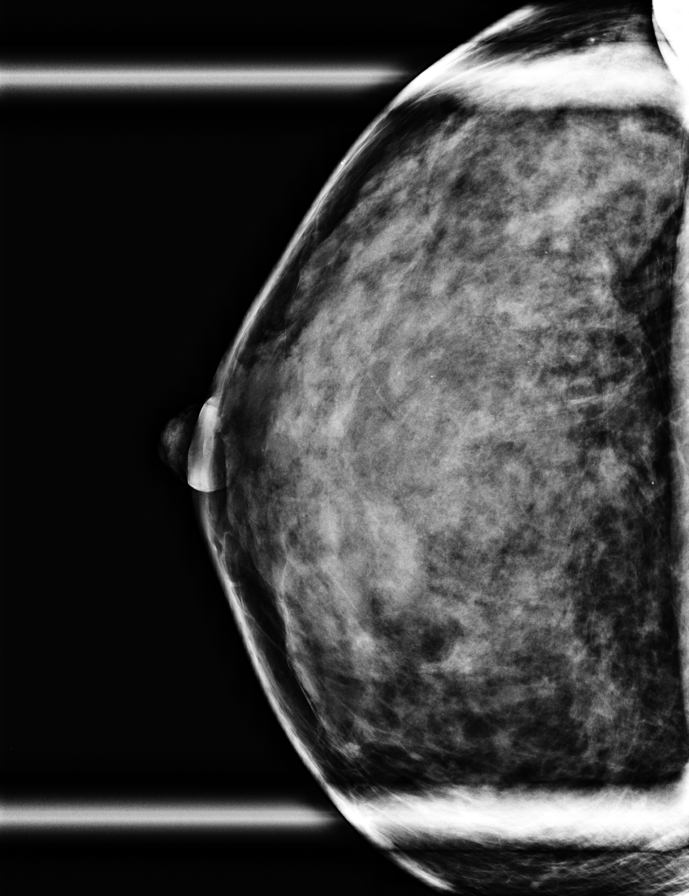

[R ML (2 of 2)]
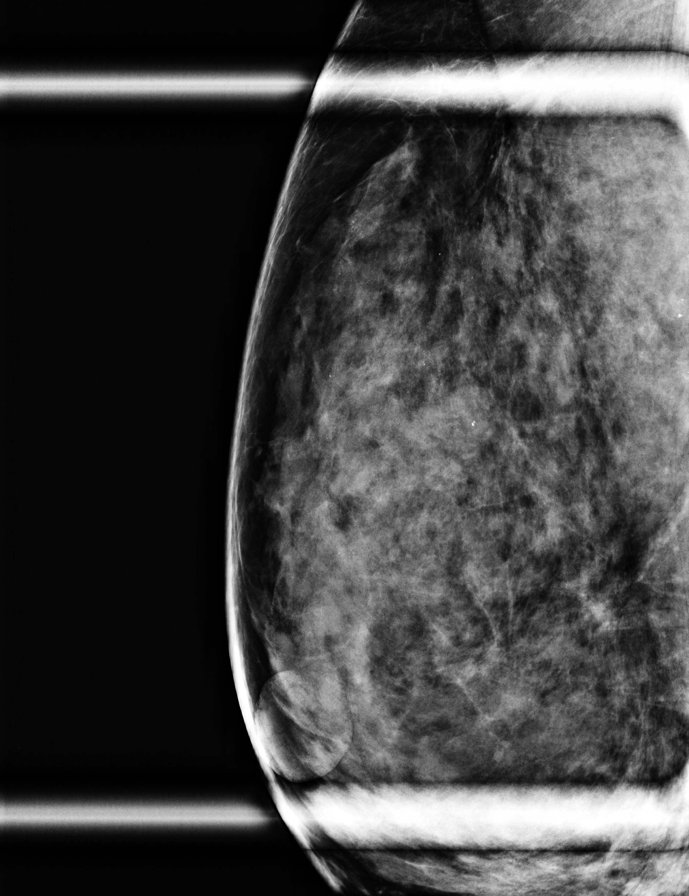

[R MLO synth-2D]
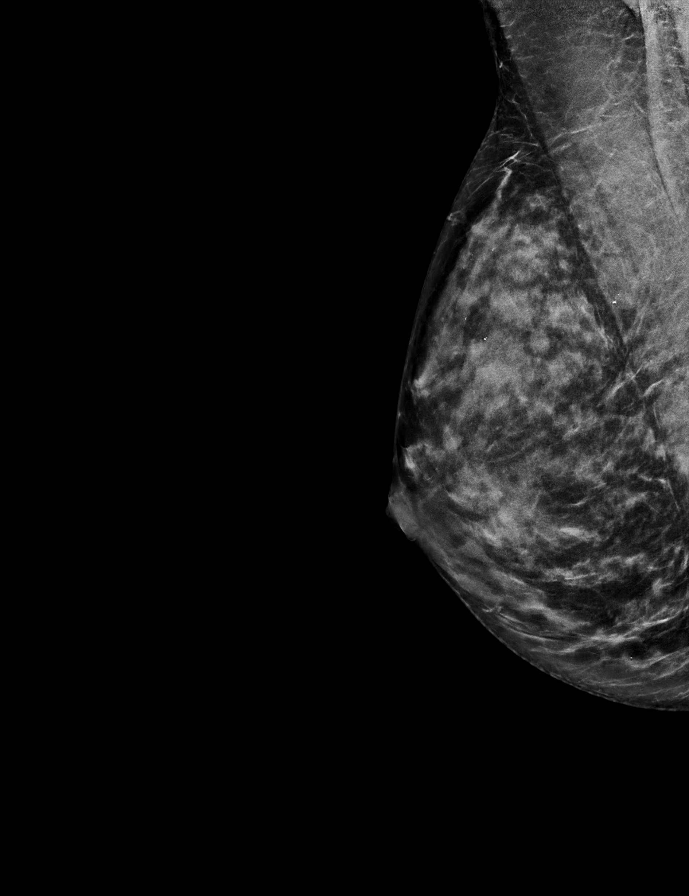

[L MLO synth-2D]
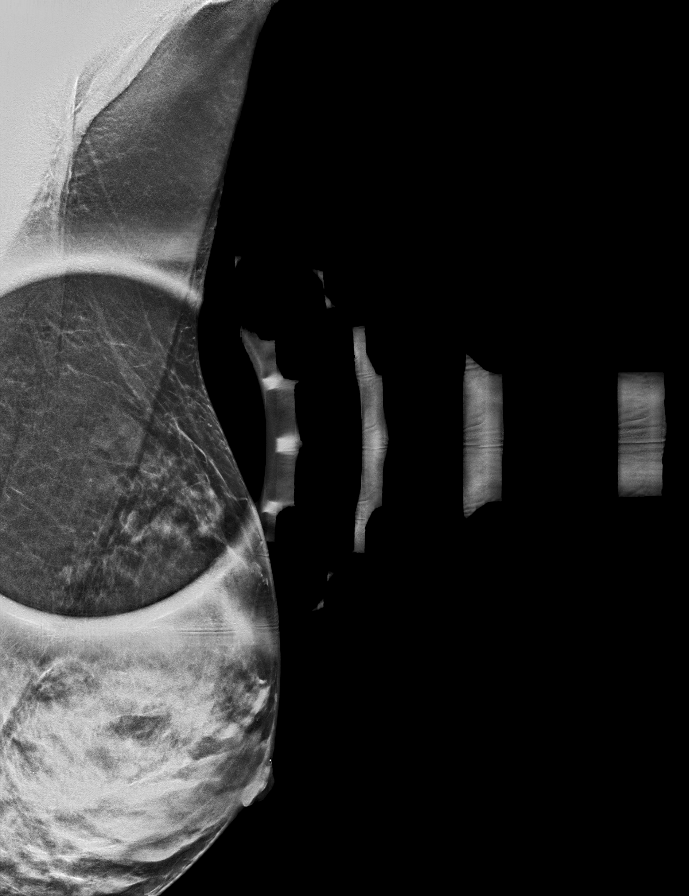

[L XCCL synth-2D]
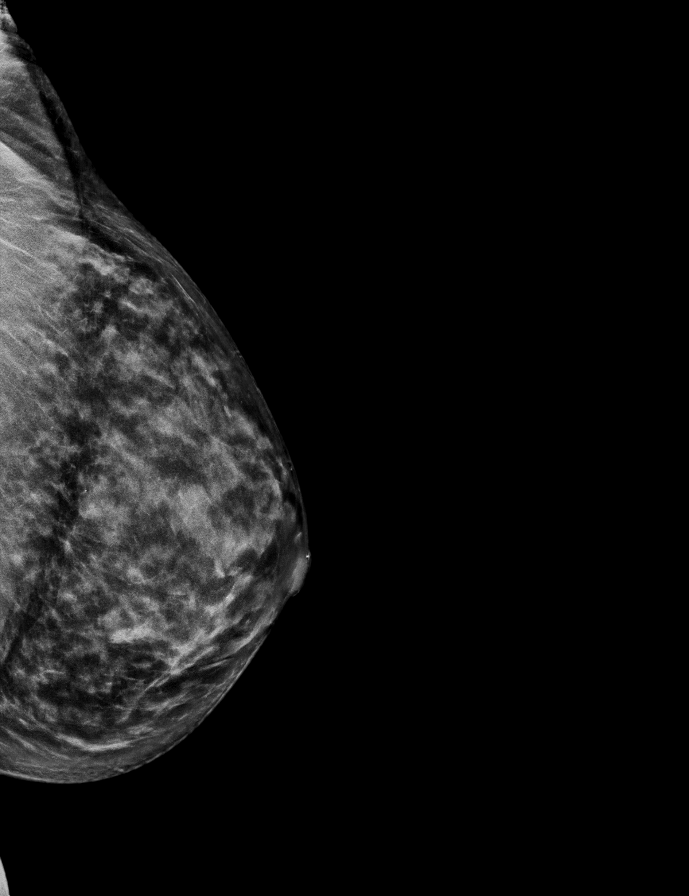

[R CC synth-2D]
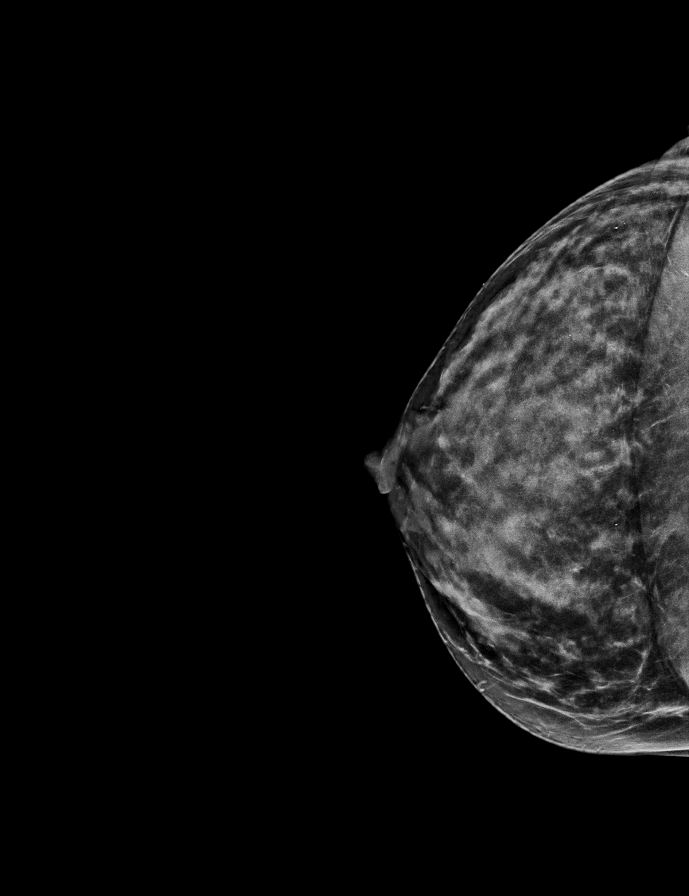

[8 of 40 positions shown; findings below may reference images not displayed]

ACR Breast Density Category c: The breast tissue is heterogeneously
dense, which may obscure small masses.
FINDINGS: Magnification views of the right breast were obtained. Demonstrated
best on the MLO view superiorly there is a 1.2 cm linearly oriented
group of punctate calcifications. Additionally, just inferior to
this group is an additional 3 mm group of round and punctate
calcifications. Given the fine punctate nature of these
calcifications, these are difficult to compare with the recent prior
exams.

Within the upper-outer left breast there is a focal asymmetry which
partially effaces with additional imaging.

On physical exam, no discrete mass is palpated within the
upper-outer left breast.

Targeted ultrasound is performed, showing normal tissue without
suspicious mass within the upper-outer right breast.

Within the upper-outer left breast, normal tissue is visualized. No
suspicious mass identified.
IMPRESSION: Indeterminate calcifications within the superior right breast.

Dense tissue within the upper-outer left breast.

RECOMMENDATION:
Stereotactic guided core needle biopsy 2 sites right breast
calcifications. This was discussed with the patient and the patient
does not wish to have a biopsy at this time. As the biopsy is
declined, recommended a six-month follow-up of the right breast
calcifications to reassess.

I have discussed the findings and recommendations with the patient.
If applicable, a reminder letter will be sent to the patient
regarding the next appointment.

BI-RADS CATEGORY  4: Suspicious.

## 2022-04-04 IMAGING — US US BREAST*L* LIMITED INC AXILLA
1 series · 1 of 1 positions shown · non-contrast
Comparison: Previous exam(s).

CLINICAL DATA: Patient for follow-up of right breast
calcifications.



[Series 1: us breast*left* limited inc axilla · 0.07mm/px · 1 of 1 slices shown]
[im 1/1]
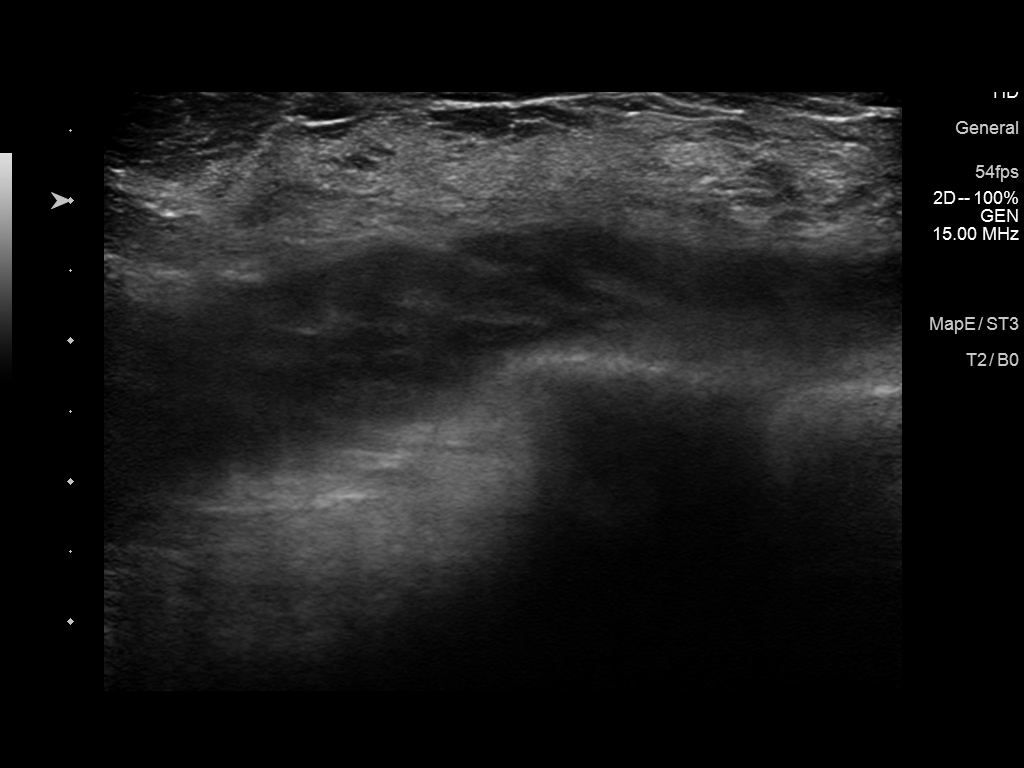

[1 of 1 positions shown; findings below may reference images not displayed]

ACR Breast Density Category c: The breast tissue is heterogeneously
dense, which may obscure small masses.
FINDINGS: Magnification views of the right breast were obtained. Demonstrated
best on the MLO view superiorly there is a 1.2 cm linearly oriented
group of punctate calcifications. Additionally, just inferior to
this group is an additional 3 mm group of round and punctate
calcifications. Given the fine punctate nature of these
calcifications, these are difficult to compare with the recent prior
exams.

Within the upper-outer left breast there is a focal asymmetry which
partially effaces with additional imaging.

On physical exam, no discrete mass is palpated within the
upper-outer left breast.

Targeted ultrasound is performed, showing normal tissue without
suspicious mass within the upper-outer right breast.

Within the upper-outer left breast, normal tissue is visualized. No
suspicious mass identified.
IMPRESSION: Indeterminate calcifications within the superior right breast.

Dense tissue within the upper-outer left breast.

RECOMMENDATION:
Stereotactic guided core needle biopsy 2 sites right breast
calcifications. This was discussed with the patient and the patient
does not wish to have a biopsy at this time. As the biopsy is
declined, recommended a six-month follow-up of the right breast
calcifications to reassess.

I have discussed the findings and recommendations with the patient.
If applicable, a reminder letter will be sent to the patient
regarding the next appointment.

BI-RADS CATEGORY  4: Suspicious.

## 2022-04-12 ENCOUNTER — Ambulatory Visit: Payer: BC Managed Care – PPO | Attending: Obstetrics and Gynecology | Admitting: Physical Therapy

## 2022-04-12 DIAGNOSIS — M6208 Separation of muscle (nontraumatic), other site: Secondary | ICD-10-CM | POA: Insufficient documentation

## 2022-04-12 DIAGNOSIS — R278 Other lack of coordination: Secondary | ICD-10-CM | POA: Insufficient documentation

## 2022-04-12 DIAGNOSIS — M26609 Unspecified temporomandibular joint disorder, unspecified side: Secondary | ICD-10-CM | POA: Diagnosis present

## 2022-04-12 DIAGNOSIS — M62838 Other muscle spasm: Secondary | ICD-10-CM | POA: Diagnosis present

## 2022-04-12 DIAGNOSIS — M533 Sacrococcygeal disorders, not elsewhere classified: Secondary | ICD-10-CM | POA: Diagnosis present

## 2022-04-12 DIAGNOSIS — M545 Low back pain, unspecified: Secondary | ICD-10-CM | POA: Insufficient documentation

## 2022-04-12 DIAGNOSIS — G8929 Other chronic pain: Secondary | ICD-10-CM | POA: Diagnosis present

## 2022-04-12 NOTE — Therapy (Signed)
[place] MAIN [facility] SERVICES [address] [place], [place], [address] Phone: [phone]   Fax:  [phone]  Physical Therapy Treatment  Patient Details  Name: Dorothy Owens MRN: [id number] Date of Birth: Aug 06, 1970 No data recorded   Encounter Date: 04/12/2022   PT End of Session - 04/12/22 1338     Visit Number 66    Date for PT Re-Evaluation 05/03/22    PT Start Time 1332    PT Stop Time 1415    PT Time Calculation (min) 43 min    Activity Tolerance Patient tolerated treatment well    Behavior During Therapy [facility] for tasks assessed/performed               No past medical history on file.  No past surgical history on file.  There were no vitals filed for this visit.   Subjective Assessment - 04/12/22 1358     Subjective Pt has not been doing the youtube fitness program for the past month. Pt still feel soreness in her back and legs.  Pt has not been doing her PT HEP, stretches, walking. Pt has been dancing and teaching dance. Pt is aware of keeping her shoulders down and connected ot her core. Pt tried intercourse twice and she did not bleed afterwards which is an improvement. Pt still feels some soreness afterwards but she has not done stretches afterwards.    Pertinent History Hx of twisted ankles R and L,  fall on ice skating in 2013 on R buttock, TMJ, not perinmenopausal yet but periods are shorter    Patient Stated Goals improving skeletal health, bone health and balance                  Pelvic Floor Special Questions - 04/12/22 1349     Pelvic Floor Internal Exam pt consented verbally and had no contraindications    Exam Type Vaginal    Palpation no tightness at pelvic floor , pelvic organs behind pubic symphysis, demo'd proper upward coordination of anterior mm without ab overuse                 [facility] Adult PT Treatment/Exercise - 03/07/22 1424       Modalities   Modalities Moist Heat       Moist Heat Therapy   Number Minutes Moist Heat 4 Minutes    Moist Heat Location --   perineum , butterfly supported     Manual Therapy   Internal Pelvic Floor STM/MWM at problem areas noted in assessment to promote more anterior tilt of pelvis and less pain   Therapeautic Activity                                                       Discussed fitness modifications and pelvic pain                  PT Long Term Goals -       PT LONG TERM GOAL #1   Title Pt will demo proper body mechanics to minimize straining of abdomen and pelvic floor with fitness routine (modifications to sit-up/ crunches) ( weight lifting)    Time 4    Period Weeks    Status Achieved      PT LONG TERM GOAL #2  Title Pt will report no LBP occuring across across one month to continue with fitness and ADLs activities  ( 04/05/21: no more radiating pain and only soreness at times)    Time 8    Period Weeks    Status Achieved      PT LONG TERM GOAL #3   Title Pt will make modifcations to her sitting posture/ work station at work in order to minimize overactivity of pelvic floor    Time 8    Period Weeks    Status Achieved      PT LONG TERM GOAL #4   Title Pt will demo proper deep core coordination with proper diaphragmatic excursion in order to optimize postural stability and pelvic floor function    Time 10    Period Weeks    Status Achieved      PT LONG TERM GOAL #5   Title Pt will report no straining with bowel movements 100% of the time and improved boewl frequency from 3-4x/week to daily or every other day across 2 weeks in order to restore pelvic floor function    Time 6    Period Weeks    Status Achieved      PT LONG TERM GOAL #6   Title Pt will demo decreased abdominal separation from 3 fingers width above umbilicus to < 1 fingers width and increased lateral diaphragmatic excursion in order to progress to proper pelvic floor lengthening to minimize pelvic pain and restore bowel movements  and sexual function without difficulty    Time 10    Period Weeks    Status Achieved      PT LONG TERM GOAL #7   Title Pt will report decreased TMJ pain from 4/10 to < 1/10 and be IND with relaxation practices in order to increase QOL  ( 04/05/21: 3/10,  05/04/21:  3/10 , 09/14/21: 3/10)    Time 8    Period Weeks    Status Partially Met    Target Date 11/09/21      PT LONG TERM GOAL #8   Title Pt will demo modifications to fitness routine with IND and proper alignment to strengthen underused mm systems to minimize risk for injuries    Time 10    Period Weeks    Status Achieved      PT LONG TERM GOAL  #9   TITLE Pt will demo no tightness of pelvic floor mm across 2 visits in order restore pelvic function    Time 10    Period Weeks    Status Partially Met    Target Date 04/05/2022        PT LONG TERM GOAL  #10   TITLE Pt will demo increased cervical endurance test from 2:53 to > 3:30  in order to improve head posture and decreased overuse of  upper trap/ scalenes to return to ull ups with less risk of injuries  ( 04/05/21                                             3: 55 sec)    Time 10    Period Weeks    Status Achieved    Target Date 05/17/21      PT LONG TERM GOAL  #11   TITLE Pt will demo no upper trap overuse with proper cervical retraction and deep shoulder /  thoraco mm co-activation with 3 pullup ( shoulderabduction) without issues after one set across     2 days    Time 10    Period Weeks    Status Deferred    Target Date 07/14/21      PT LONG TERM GOAL  #12   TITLE Pt will demo no increased L paraspinal mm tightness and be compliant with complimentary stretches for L rotation to balance out dancing which involves more R thoracic rotation in order to enjoy dancing with less relapse of spinal asymmetries    Time 8    Period Weeks    Status New    Target Date 04/19/2022                         Plan -     Clinical Impression Statement Pt demo'd much  less lowered position of urethra/ bladder with proper technique to elicit upward coordination of pelvic floor without overuse of her ab mm pt showed no more tightness at B obturator internus.  Reinforced maintaining pelvic floor stretches. Discussed future appts and sexual function of pelvic floor and her improvments, reviewed her progression of improvements and what HEP to maintain and rationale. Plan to d/c at next visit  Pt continues to benefit from skilled PT        Personal Factors and Comorbidities Fitness;Other    Stability/Clinical Decision Making Evolving/Moderate complexity    Rehab Potential Good    PT Frequency 2x / month   PT Duration Other (comment)   10   PT Treatment/Interventions Balance training;Neuromuscular re-education;Gait training;Moist Heat;Functional mobility training;Therapeutic activities;Patient/family education;Manual techniques;Therapeutic exercise;Taping;Spinal Manipulations;Joint Manipulations;Scar mobilization;Stair training;Traction;Energy conservation    Consulted and Agree with Plan of Care Patient                 Patient will benefit from skilled therapeutic intervention in order to improve the following deficits and impairments:  Increased muscle spasms,Decreased mobility,Decreased coordination,Decreased endurance,Decreased activity tolerance,Decreased range of motion,Decreased strength,Improper body mechanics,Pain,Postural dysfunction    Visit Diagnosis: Sacrococcygeal disorders, not elsewhere classified  Other muscle spasm  Other lack of coordination  Chronic bilateral low back pain without sciatica  TMJ dysfunction  Diastasis recti     Problem List There are no problems to display for this patient.   Jerl Mina, PT 04/12/2022, 7:10 PM  [place] MAIN [facility] SERVICES [address] [place], [place], [address] Phone: [phone]   Fax:  [phone]  Name: Dorothy Owens MRN:  [id number] Date of Birth: [birth year]

## 2022-04-12 NOTE — Patient Instructions (Addendum)
Pelvic floor stretches before/ after sexual intercourse   Keep up with deep core level 1-2 across 3-5 x week  Maintain the complimentary stretches to your youtube workout  and multifidis

## 2022-04-18 ENCOUNTER — Encounter: Payer: BC Managed Care – PPO | Admitting: Physical Therapy

## 2022-04-26 ENCOUNTER — Ambulatory Visit: Payer: BC Managed Care – PPO | Admitting: Physical Therapy

## 2022-04-26 DIAGNOSIS — M26609 Unspecified temporomandibular joint disorder, unspecified side: Secondary | ICD-10-CM

## 2022-04-26 DIAGNOSIS — M533 Sacrococcygeal disorders, not elsewhere classified: Secondary | ICD-10-CM | POA: Diagnosis not present

## 2022-04-26 DIAGNOSIS — M545 Low back pain, unspecified: Secondary | ICD-10-CM

## 2022-04-26 DIAGNOSIS — M62838 Other muscle spasm: Secondary | ICD-10-CM

## 2022-04-26 DIAGNOSIS — R278 Other lack of coordination: Secondary | ICD-10-CM

## 2022-04-26 NOTE — Therapy (Signed)
[place] MAIN [facility] SERVICES [address] [place], [place], [address] Phone: [phone]   Fax:  [phone]  Physical Therapy Treatment / Discharge Summary across 47 visits  Patient Details  Name: Dorothy Owens MRN: [id number] Date of Birth: October 20, 1970 No data recorded   Encounter Date: 04/26/2022   PT End of Session - 04/26/22 1339     Visit Number 35    Date for PT Re-Evaluation 05/03/22    PT Start Time 1333    PT Stop Time 1415    PT Time Calculation (min) 42 min    Activity Tolerance Patient tolerated treatment well    Behavior During Therapy [facility] for tasks assessed/performed               No past medical history on file.  No past surgical history on file.  There were no vitals filed for this visit.    Subjective Assessment - 04/26/22 1413     Subjective Pt has not been doing tany fitness due to the holidays. Pt thinks she slept wrong last night. Pt feels a pain by  R shoulder blade.    Pertinent History Hx of twisted ankles R and L,  fall on ice skating in 2013 on R buttock, TMJ, not perinmenopausal yet but periods are shorter    Patient Stated Goals improving skeletal health, bone health and balance                  [facility] PT Assessment - 04/26/22 1340       Coordination   Coordination and Movement Description delayed scapular downward on R      Palpation   Spinal mobility tightness/ tenderness at medial scapula R,    Palpation comment tightness along T 7-10 R intercostals, medial scapula R, L occipital > R  , ptyergoids L not as tight, no deviations at cervical segments                   [facility] Adult PT Treatment/Exercise - 03/07/22 1424       Modalities   Modalities Moist Heat      Moist Heat Therapy   Number Minutes Moist Heat 4 Minutes    Moist Heat Location --   perineum , butterfly supported     Manual Therapy   Internal Pelvic Floor STM/MWM at problem areas noted in  assessment to promote more anterior tilt of pelvis and less pain   Therapeautic Activity                                                       Discussed fitness modifications and pelvic pain                  PT Long Term Goals -       PT LONG TERM GOAL #1   Title Pt will demo proper body mechanics to minimize straining of abdomen and pelvic floor with fitness routine (modifications to sit-up/ crunches) ( weight lifting)    Time 4    Period Weeks    Status Achieved      PT LONG TERM GOAL #2   Title Pt will report no LBP occuring across across one month to continue with fitness and ADLs activities  ( 04/05/21: no more radiating pain and only  soreness at times)    Time 8    Period Weeks    Status Achieved      PT LONG TERM GOAL #3   Title Pt will make modifcations to her sitting posture/ work station at work in order to minimize overactivity of pelvic floor    Time 8    Period Weeks    Status Achieved      PT LONG TERM GOAL #4   Title Pt will demo proper deep core coordination with proper diaphragmatic excursion in order to optimize postural stability and pelvic floor function    Time 10    Period Weeks    Status Achieved      PT LONG TERM GOAL #5   Title Pt will report no straining with bowel movements 100% of the time and improved boewl frequency from 3-4x/week to daily or every other day across 2 weeks in order to restore pelvic floor function    Time 6    Period Weeks    Status Achieved      PT LONG TERM GOAL #6   Title Pt will demo decreased abdominal separation from 3 fingers width above umbilicus to < 1 fingers width and increased lateral diaphragmatic excursion in order to progress to proper pelvic floor lengthening to minimize pelvic pain and restore bowel movements and sexual function without difficulty    Time 10    Period Weeks    Status Achieved      PT LONG TERM GOAL #7   Title Pt will report decreased TMJ pain from 4/10 to < 1/10 and be IND with  relaxation practices in order to increase QOL  ( 04/05/21: 3/10,  05/04/21:  3/10 , 09/14/21: 3/10,  04/26/22:  6/10    Time 8    Period Weeks    Status Partially Met    Target Date 11/09/21      PT LONG TERM GOAL #8   Title Pt will demo modifications to fitness routine with IND and proper alignment to strengthen underused mm systems to minimize risk for injuries    Time 10    Period Weeks    Status Achieved      PT LONG TERM GOAL  #9   TITLE Pt will demo no tightness of pelvic floor mm across 2 visits in order restore pelvic function    Time 10    Period Weeks    Status Achieved    Target Date 04/05/2022        PT LONG TERM GOAL  #10   TITLE Pt will demo increased cervical endurance test from 2:53 to > 3:30  in order to improve head posture and decreased overuse of  upper trap/ scalenes to return to ull ups with less risk of injuries  ( 04/05/21                                             3: 55 sec)    Time 10    Period Weeks    Status Achieved    Target Date 05/17/21      PT LONG TERM GOAL  #11   TITLE Pt will demo no upper trap overuse with proper cervical retraction and deep shoulder / thoraco mm co-activation with 3 pullup ( shoulderabduction) without issues after one set across     2 days  Time 10    Period Weeks    Status Deferred    Target Date 07/14/21      PT LONG TERM GOAL  #12   TITLE Pt will demo no increased L paraspinal mm tightness and be compliant with complimentary stretches for L rotation to balance out dancing which involves more R thoracic rotation in order to enjoy dancing with less relapse of spinal asymmetries    Time 8    Period Weeks    Status Achieved    Target Date 04/19/2022                         Plan -     Clinical Impression Statement   Pt met 11/12 goals and partially met her last goal. LBP and midback pain have improved significantly  Deep core system and trunk stability has been maintained. Curvatures in spine at  cervical/ thoracic, and uneven pelvic girdle have been addressed.  Pt modified her fitness routine to minimize overdominance of upper trap/ thoracic kyphosis, and obliques which limited deep core system. Pt has been doing a new routine with another trainer online instead of gym trainer and she had no relapse of Sx.    Pt also showed no more pelvic floor tightness which was a major improvement.  Pain with sexual intercourse has decreased by 40%.   Pt voiced understanding with compliance with HEP.   Pt ready for d/c.       Personal Factors and Comorbidities Fitness;Other    Stability/Clinical Decision Making Evolving/Moderate complexity    Rehab Potential Good    PT Frequency 2x / month   PT Duration Other (comment)   10   PT Treatment/Interventions Balance training;Neuromuscular re-education;Gait training;Moist Heat;Functional mobility training;Therapeutic activities;Patient/family education;Manual techniques;Therapeutic exercise;Taping;Spinal Manipulations;Joint Manipulations;Scar mobilization;Stair training;Traction;Energy conservation    Consulted and Agree with Plan of Care Patient                 Patient will benefit from skilled therapeutic intervention in order to improve the following deficits and impairments:  Increased muscle spasms,Decreased mobility,Decreased coordination,Decreased endurance,Decreased activity tolerance,Decreased range of motion,Decreased strength,Improper body mechanics,Pain,Postural dysfunction    Visit Diagnosis: Other muscle spasm  Other lack of coordination  TMJ dysfunction  Chronic bilateral low back pain without sciatica  Sacrococcygeal disorders, not elsewhere classified     Problem List There are no problems to display for this patient.   Jerl Mina, PT 04/26/2022, 2:13 PM  [place] MAIN [facility] SERVICES [address] [place], [place], [address] Phone: [phone]   Fax:   [phone]  Name: Kimbrely Buckel MRN: [id number] Date of Birth: [birth year]

## 2022-06-30 ENCOUNTER — Ambulatory Visit
Admission: RE | Admit: 2022-06-30 | Discharge: 2022-06-30 | Disposition: A | Discharge status: Home / Self Care | Payer: BC Managed Care – PPO | Source: Ambulatory Visit | Attending: Obstetrics and Gynecology | Admitting: Obstetrics and Gynecology

## 2022-06-30 DIAGNOSIS — R921 Mammographic calcification found on diagnostic imaging of breast: Secondary | ICD-10-CM

## 2023-01-11 ENCOUNTER — Other Ambulatory Visit: Payer: Self-pay | Admitting: Family Medicine

## 2023-01-11 DIAGNOSIS — Z1231 Encounter for screening mammogram for malignant neoplasm of breast: Secondary | ICD-10-CM

## 2023-02-13 ENCOUNTER — Encounter: Payer: Self-pay | Admitting: Physical Therapy

## 2023-02-13 ENCOUNTER — Ambulatory Visit: Payer: BC Managed Care – PPO | Attending: Sports Medicine | Admitting: Physical Therapy

## 2023-02-13 DIAGNOSIS — M357 Hypermobility syndrome: Secondary | ICD-10-CM | POA: Insufficient documentation

## 2023-02-13 DIAGNOSIS — G8929 Other chronic pain: Secondary | ICD-10-CM | POA: Diagnosis present

## 2023-02-13 DIAGNOSIS — M25512 Pain in left shoulder: Secondary | ICD-10-CM | POA: Insufficient documentation

## 2023-02-13 DIAGNOSIS — M25511 Pain in right shoulder: Secondary | ICD-10-CM | POA: Diagnosis present

## 2023-02-13 NOTE — Therapy (Signed)
OUTPATIENT PHYSICAL THERAPY SHOULDER EVALUATION   Patient Name: Dorothy Owens MRN: [id number] DOB:02-12-1971, 52 y.o., female Today's Date: 02/13/2023  END OF SESSION:  PT End of Session - 02/13/23 0940     Visit Number 1    Number of Visits 6    Date for PT Re-Evaluation 03/27/23    PT Start Time 0930    PT Stop Time 1018    PT Time Calculation (min) 48 min    Activity Tolerance Patient tolerated treatment well    Behavior During Therapy [facility] for tasks assessed/performed             History reviewed. No pertinent past medical history. History reviewed. No pertinent surgical history. There are no problems to display for this patient.   PCP: Clydie Braun MD   REFERRING PROVIDER: Andrena Mews, DO  REFERRING DIAG: W29.562 (ICD-10-CM) - Pain in right shoulder M25.512 (ICD-10-CM) - Pain in left shoulder M35.7 (ICD-10-CM) - Hypermobility syndrome  THERAPY DIAG:  Chronic pain of both shoulders  Hypermobility syndrome  Rationale for Evaluation and Treatment: Rehabilitation  ONSET DATE: chronic   SUBJECTIVE:                                                                                                                                                                                      SUBJECTIVE STATEMENT:   Patient referred by Dr. Berline Chough for shoulder pain in the presence of Joint Hypermobility Syndrome.  She has had a long history of PT, chiropractic and other modalities over the years.  She is well versed in the hypermobility space.  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  She is hoping to start with a new trainer this week. She feels that her routine has not been effective in that she is not progressing and has poor recovery.  She has symptoms mostly in her Rt UE but some L shoulder as well. Patient has Rt scapular and Rt UE pain .  She reports catching in L scapula as well. She has decreased mobility compared  to L but knows her ROM is functional.  She has tightness in her neck, at times mid and lower back.  Popping in hips, ankles.  She has pelvic floor tightness and did PT for 1.5 yrs.     Hand dominance: Right  PERTINENT HISTORY: Carylon Perches Danlos Syndrome?   PAIN:  Are you having pain? Yes: NPRS scale: 1-2/10 Pain location: Rt shoulder anterior and Rt scap Pain description: aggravated Aggravating factors: pull ups , impact  Relieving factors: rest   PRECAUTIONS: Other: Hypermobility      RED FLAGS: None  WEIGHT BEARING RESTRICTIONS: No  FALLS:  Has patient fallen in last 6 months? No  LIVING ENVIRONMENT: Lives with: lives with their partner Lives in: House/apartment Stairs: Yes: Internal: 4 steps; noneno issues  Has following equipment at home: None  OCCUPATION: Full time [employer] , some discomfort with reaching , no heavy lifting , has a raised desk  Walks at lunch   PLOF: Independent  PATIENT GOALS:Patient wants to get stronger and feel like she can progress.   NEXT MD VISIT:   OBJECTIVE:  Note: Objective measures were completed at Evaluation unless otherwise noted.  DIAGNOSTIC FINDINGS:  None available   PATIENT SURVEYS:  FOTO NA   COGNITION: Overall cognitive status: Within functional limits for tasks assessed     SENSATION: WFL  POSTURE: WFL  Beighton Score: 5/9 with the following: Trunk flexion 0/1 Knees 2/2 Elbow 2/2 Thumbs 1/2 Pinky 0/2  LOWER EXTREMITY ROM:     Passive  Right eval Left eval  Hip flexion WNL WNL  Hip extension    Hip abduction    Hip adduction    Hip internal rotation WNL, 20 deg WNL 30  Hip external rotation WNL 50 WNL 55-60  Knee flexion    Knee extension +8 +8  Ankle dorsiflexion    Ankle plantarflexion    Ankle inversion    Ankle eversion     (Blank rows = not tested)  UPPER EXTREMITY ROM:   Active ROM  Right eval Left eval  Shoulder flexion Hyper  Hyper   Shoulder extension    Shoulder abduction     Shoulder adduction    Shoulder internal rotation T4  tight end range PROM  T2  Shoulder external rotation T2  90+ deg at 90 deg abd  T2 90 + deg at 90 deg abd   Elbow flexion    Elbow extension hyper hyper   UPPER EXTREMITY MMT:  MMT Right eval Left eval  Shoulder flexion 4+ 5  Shoulder extension    Shoulder abduction 4+ 5  Shoulder adduction    Shoulder internal rotation 5 5  Shoulder external rotation 4+ 5  Middle trapezius 4 4  Lower trapezius 4 4   SHOULDER SPECIAL TESTS: Impingement tests: Neer impingement test: negative SLAP lesions:  neg  Instability tests:  Neg  Rotator cuff assessment: Empty can test: negative Biceps assessment:  NT   JOINT MOBILITY TESTING:  Hypermobile in shoulder ROM but no apprehension Spine mobility WNL, stiff in thoracic spine    PALPATION:  Pain with palpation Rt medial scapular border and Rt upper trap , Rt ant deltoid    TODAY'S TREATMENT:                                                                                                                                         DATE: 02/13/23  [facility] Adult PT Treatment:  DATE: 02/13/23 Therapeutic Exercise: Side-lying open book thoracic rotation bent elbow for improved comfort and left shoulder stretch Side-lying external rotation 4 pounds x 45 sec  Side-lying reverse fly 4 pounds x 45 sec  Self Care: Self care, reducing neck tension with shoulder exercises, strengthening 1 muscle group a time to reduce systemic fatigue hypermobility syndrome versus Ehlers-Danlos syndrome, Pilates, POC   PATIENT EDUCATION: Education details: HEP, central nervous system fatigue vs local , pilates  Person educated: Patient Education method: Explanation, Demonstration, and Verbal cues Education comprehension: verbalized understanding and needs further education  HOME EXERCISE PROGRAM: Access Code: [email or web address] URL: [email or web address] Date:  02/13/2023 Prepared by: Karie Mainland  Exercises - Shoulder W - External Rotation with Resistance  - 1 x daily - 7 x weekly - 2 sets - 10 reps - 5 hold - Standing Shoulder Horizontal Abduction with Resistance  - 1 x daily - 7 x weekly - 2 sets - 10 reps - 5 hold - Standing Shoulder Diagonal Horizontal Abduction 60/120 Degrees with Resistance  - 1 x daily - 7 x weekly - 2 sets - 10 reps - 5 hold - Sidelying Shoulder ER with Towel and Dumbbell  - 1 x daily - 7 x weekly - 2 sets - 10 reps - 5 hold - Sidelying Shoulder Scaption  - 1 x daily - 7 x weekly - 2 sets - 10 reps - 5 hold  ASSESSMENT:  CLINICAL IMPRESSION: Patient is a 52 y.o. female who was seen today for physical therapy evaluation and treatment for shoulder pain with hypermobility syndrome.  Patient will be seen 3-4 times in the clinic and will eventually participate in community classes if it suits her.  Clearly she had hypermobility in her shoulders and knees but none noted in her spine or hips.   OBJECTIVE IMPAIRMENTS: decreased strength, increased fascial restrictions, impaired perceived functional ability, impaired flexibility, impaired UE functional use, and hypermobility  .   ACTIVITY LIMITATIONS: lifting and sitting  PARTICIPATION LIMITATIONS: community activity and occupation  PERSONAL FACTORS: Past/current experiences, Time since onset of injury/illness/exacerbation, and 1 comorbidity: joint hypermobility syndrome   are also affecting patient's functional outcome.   REHAB POTENTIAL: Excellent  CLINICAL DECISION MAKING: Unstable/unpredictable  EVALUATION COMPLEXITY: High   GOALS: Goals reviewed with patient? Yes   LONG TERM GOALS: Target date: 03/27/2023    Pt will be able to show I with HEP for shoulders, upper back without exacerbating neck/joint pain  Baseline: given on eval  Goal status: INITIAL  2.  Pt will be able to manage Rt shoulder and neck pain with strengthening and frequent postural changes  throughout the day Baseline: has tried many different modalities Goal status: INITIAL  3.  Pt will be able to complete gym workout with a trainer without exacerbation of shoulder pain Baseline: starting soon Goal status: INITIAL  4.  Pt will be able to increase weight for shoulder exercises including chest press LAT pulldown and row.  Baseline: has not been able ot do this  Goal status: INITIAL  5.  Pt will report overall improved recovery from brief 15 min workouts Baseline:  Goal status: INITIAL  PLAN:  PT FREQUENCY: 1x/week  PT DURATION: 6 weeks  PLANNED INTERVENTIONS: 97164- PT Re-evaluation, 97110-Therapeutic exercises, 97530- Therapeutic activity, 97112- Neuromuscular re-education, 97535- Self Care, 16109- Manual therapy, Patient/Family education, Taping, Dry Needling, Spinal mobilization, Cryotherapy, and Moist heat  PLAN FOR NEXT SESSION: check HEP. Begin pilates. Thoracic mobility.  Dynamic shoulder stabilization. Ask about diet, sleep, stress  Zaray Gatchel, PT 02/13/2023, 1:08 PM   Karie Mainland, PT 02/13/23 4:53 PM Phone: [phone] Fax: [phone]

## 2023-03-11 NOTE — Therapy (Addendum)
OUTPATIENT PHYSICAL THERAPY SHOULDER DISCHARGE   Patient Name: Dorothy Owens MRN: [id number] DOB:11-26-1970, 52 y.o., female Today's Date: 03/12/2023   PHYSICAL THERAPY DISCHARGE SUMMARY  Visits from Start of Care: 2  Current functional level related to goals / functional outcomes: See below    Remaining deficits: Pain and joint hypermobility    Education / Equipment: HEP, Pilates    Patient agrees to discharge. Patient goals were partially met. Patient is being discharged due to the patient's request.  END OF SESSION:  PT End of Session - 03/12/23 1330     Visit Number 2    Number of Visits 6    Date for PT Re-Evaluation 03/27/23    Authorization Type [employer]    PT Start Time 1332    PT Stop Time 1414    PT Time Calculation (min) 42 min    Activity Tolerance Patient tolerated treatment well    Behavior During Therapy WFL for tasks assessed/performed              History reviewed. No pertinent past medical history. History reviewed. No pertinent surgical history. There are no problems to display for this patient.   PCP: Clydie Braun MD   REFERRING PROVIDER: Andrena Mews, DO  REFERRING DIAG: O96.295 (ICD-10-CM) - Pain in right shoulder M25.512 (ICD-10-CM) - Pain in left shoulder M35.7 (ICD-10-CM) - Hypermobility syndrome  THERAPY DIAG:  Chronic pain of both shoulders  Hypermobility syndrome  Rationale for Evaluation and Treatment: Rehabilitation  ONSET DATE: chronic   SUBJECTIVE:                                                                                                                                                                                      SUBJECTIVE STATEMENT:   I am off today.  Did the exercises with the app.  Her traps began to light up with some of the exericises, so I had to lie down. I did get in touch with a trainer.  Catching in L shoulder.  Needs help with engaging lats.     Patient referred by Dr. Berline Chough for  shoulder pain in the presence of Joint Hypermobility Syndrome.  She has had a long history of PT, chiropractic and other modalities over the years.  She is well versed in the hypermobility space.  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  She is hoping to start with a new trainer this week. She feels that her routine has not been effective in that she is not progressing and has poor recovery.  She has symptoms mostly in her Rt UE but some L shoulder  as well. Patient has Rt scapular and Rt UE pain .  She reports catching in L scapula as well. She has decreased mobility compared to L but knows her ROM is functional.  She has tightness in her neck, at times mid and lower back.  Popping in hips, ankles.  She has pelvic floor tightness and did PT for 1.5 yrs.     Hand dominance: Right  PERTINENT HISTORY: Carylon Perches Danlos Syndrome?   PAIN:  Are you having pain? Yes: NPRS scale: 1-2/10 Pain location: Rt shoulder anterior and Rt scap Pain description: aggravated Aggravating factors: pull ups , impact  Relieving factors: rest   PRECAUTIONS: Other: Hypermobility      RED FLAGS: None   WEIGHT BEARING RESTRICTIONS: No  FALLS:  Has patient fallen in last 6 months? No  LIVING ENVIRONMENT: Lives with: lives with their partner Lives in: House/apartment Stairs: Yes: Internal: 4 steps; noneno issues  Has following equipment at home: None  OCCUPATION: Full time [employer] , some discomfort with reaching , no heavy lifting , has a raised desk  Walks at lunch   PLOF: Independent  PATIENT GOALS:Patient wants to get stronger and feel like she can progress.   NEXT MD VISIT:   OBJECTIVE:  Note: Objective measures were completed at Evaluation unless otherwise noted.  DIAGNOSTIC FINDINGS:  None available   PATIENT SURVEYS:  FOTO NA   COGNITION: Overall cognitive status: Within functional limits for tasks  assessed     SENSATION: WFL  POSTURE: WFL  Beighton Score: 5/9 with the following: Trunk flexion 0/1 Knees 2/2 Elbow 2/2 Thumbs 1/2 Pinky 0/2  LOWER EXTREMITY ROM:     Passive  Right eval Left eval  Hip flexion WNL WNL  Hip extension    Hip abduction    Hip adduction    Hip internal rotation WNL, 20 deg WNL 30  Hip external rotation WNL 50 WNL 55-60  Knee flexion    Knee extension +8 +8  Ankle dorsiflexion    Ankle plantarflexion    Ankle inversion    Ankle eversion     (Blank rows = not tested)  UPPER EXTREMITY ROM:   Active ROM  Right eval Left eval  Shoulder flexion Hyper  Hyper   Shoulder extension    Shoulder abduction    Shoulder adduction    Shoulder internal rotation T4  tight end range PROM  T2  Shoulder external rotation T2  90+ deg at 90 deg abd  T2 90 + deg at 90 deg abd   Elbow flexion    Elbow extension hyper hyper   UPPER EXTREMITY MMT:  MMT Right eval Left eval  Shoulder flexion 4+ 5  Shoulder extension    Shoulder abduction 4+ 5  Shoulder adduction    Shoulder internal rotation 5 5  Shoulder external rotation 4+ 5  Middle trapezius 4 4  Lower trapezius 4 4   SHOULDER SPECIAL TESTS: Impingement tests: Neer impingement test: negative SLAP lesions:  neg  Instability tests:  Neg  Rotator cuff assessment: Empty can test: negative Biceps assessment:  NT   JOINT MOBILITY TESTING:  Hypermobile in shoulder ROM but no apprehension Spine mobility WNL, stiff in thoracic spine    PALPATION:  Pain with palpation Rt medial scapular border and Rt upper trap , Rt ant deltoid    TODAY'S TREATMENT:  [facility]                                                DATE: 03/12/23 Therapeutic Exercise: Supine red looped band: ER , chest press and overhead lift  Supine horizontal abd green  band x 10 Diagonal pull green band x 10  Pilates Reformer used for LE/core strength, postural strength, lumbopelvic disassociation and core control.  Exercises included: Supine Arms: Arcs in parallel, V x 10 each 1 Red 1 yellow  Seated arms 1 yellow ER, diagonal pull, "shaving" x 10  Long box Prone overhead press double and single arm  Seated Arms row, roll down combo  Self care: Pilates, plan of care discharge  [facility] Adult PT Treatment:                                                DATE: 02/13/23 Therapeutic Exercise: Side-lying open book thoracic rotation bent elbow for improved comfort and left shoulder stretch Side-lying external rotation 4 pounds x 45 sec  Side-lying reverse fly 4 pounds x 45 sec  Self Care: Self care, reducing neck tension with shoulder exercises, strengthening 1 muscle group a time to reduce systemic fatigue hypermobility syndrome versus Ehlers-Danlos syndrome, Pilates, POC   PATIENT EDUCATION: Education details: HEP, central nervous system fatigue vs local , pilates  Person educated: Patient Education method: Explanation, Demonstration, and Verbal cues Education comprehension: verbalized understanding and needs further education  HOME EXERCISE PROGRAM: Access Code: [email or web address] URL: [email or web address] Date: 02/13/2023 Prepared by: Karie Mainland  Exercises - Shoulder W - External Rotation with Resistance  - 1 x daily - 7 x weekly - 2 sets - 10 reps - 5 hold - Standing Shoulder Horizontal Abduction with Resistance  - 1 x daily - 7 x weekly - 2 sets - 10 reps - 5 hold - Standing Shoulder Diagonal Horizontal Abduction 60/120 Degrees with Resistance  - 1 x daily - 7 x weekly - 2 sets - 10 reps - 5 hold - Sidelying Shoulder ER with Towel and Dumbbell  - 1 x daily - 7 x weekly - 2 sets - 10 reps - 5 hold - Sidelying Shoulder Scaption  - 1 x daily - 7 x weekly - 2 sets - 10 reps - 5 hold  ASSESSMENT:  CLINICAL IMPRESSION: Patient will be discharged  at this time due to high co-pay.  Patient was given information regarding community Pilates class and solid home program which she can work on independently.  She is also beginning to work with a Psychologist, educational.  Eval: Patient is a 52 y.o. female who was seen today for physical therapy evaluation and treatment for shoulder pain with hypermobility syndrome.  Patient will be seen 3-4 times in the clinic and will eventually participate in community classes if it suits her.  Clearly she had hypermobility in her shoulders and knees but none noted in her spine or hips.   OBJECTIVE IMPAIRMENTS: decreased strength, increased fascial restrictions, impaired perceived functional ability, impaired flexibility, impaired UE functional use, and hypermobility  .   ACTIVITY LIMITATIONS: lifting and sitting  PARTICIPATION LIMITATIONS: community activity and occupation  PERSONAL FACTORS: Past/current experiences, Time since onset of injury/illness/exacerbation, and  1 comorbidity: joint hypermobility syndrome   are also affecting patient's functional outcome.   REHAB POTENTIAL: Excellent  CLINICAL DECISION MAKING: Unstable/unpredictable  EVALUATION COMPLEXITY: High   GOALS: Goals reviewed with patient? Yes   LONG TERM GOALS: Target date: 03/27/2023    Pt will be able to show I with HEP for shoulders, upper back without exacerbating neck/joint pain  Baseline: given on eval  Goal status: MET  2.  Pt will be able to manage Rt shoulder and neck pain with strengthening and frequent postural changes throughout the day Baseline: has tried many different modalities Goal status: not met   3.  Pt will be able to complete gym workout with a trainer without exacerbation of shoulder pain Baseline: starting soon Goal status: not met   4.  Pt will be able to increase weight for shoulder exercises including chest press LAT pulldown and row.  Baseline: has not been able ot do this  Goal status:not met  5.  Pt will  report overall improved recovery from brief 15 min workouts Baseline:  Goal statusnot met   PLAN:  PT FREQUENCY: 1x/week  PT DURATION: 6 weeks  PLANNED INTERVENTIONS: 97164- PT Re-evaluation, 97110-Therapeutic exercises, 97530- Therapeutic activity, 97112- Neuromuscular re-education, 97535- Self Care, 29562- Manual therapy, Patient/Family education, Taping, Dry Needling, Spinal mobilization, Cryotherapy, and Moist heat  PLAN FOR NEXT SESSION:NA    Lamekia Nolden, PT 03/12/2023, 3:15 PM   Karie Mainland, PT 03/12/23 3:15 PM Phone: [phone] Fax: [phone]

## 2023-03-12 ENCOUNTER — Ambulatory Visit: Payer: BC Managed Care – PPO | Attending: Sports Medicine | Admitting: Physical Therapy

## 2023-03-12 ENCOUNTER — Other Ambulatory Visit: Payer: Self-pay | Admitting: Physician Assistant

## 2023-03-12 ENCOUNTER — Ambulatory Visit
Admission: RE | Admit: 2023-03-12 | Discharge: 2023-03-12 | Disposition: A | Discharge status: Home / Self Care | Payer: BC Managed Care – PPO | Source: Ambulatory Visit | Attending: Physician Assistant | Admitting: Physician Assistant

## 2023-03-12 ENCOUNTER — Encounter: Payer: Self-pay | Admitting: Physical Therapy

## 2023-03-12 DIAGNOSIS — M25511 Pain in right shoulder: Secondary | ICD-10-CM | POA: Diagnosis present

## 2023-03-12 DIAGNOSIS — R053 Chronic cough: Secondary | ICD-10-CM

## 2023-03-12 DIAGNOSIS — G8929 Other chronic pain: Secondary | ICD-10-CM | POA: Insufficient documentation

## 2023-03-12 DIAGNOSIS — M357 Hypermobility syndrome: Secondary | ICD-10-CM | POA: Insufficient documentation

## 2023-03-12 DIAGNOSIS — M25512 Pain in left shoulder: Secondary | ICD-10-CM | POA: Insufficient documentation

## 2023-07-24 ENCOUNTER — Ambulatory Visit
Admission: RE | Admit: 2023-07-24 | Discharge: 2023-07-24 | Disposition: A | Discharge status: Home / Self Care | Payer: BC Managed Care – PPO | Source: Ambulatory Visit | Attending: Family Medicine | Admitting: Family Medicine

## 2023-07-24 DIAGNOSIS — Z1231 Encounter for screening mammogram for malignant neoplasm of breast: Secondary | ICD-10-CM
